# Patient Record
Sex: Male | Born: 1960 | Race: White | Hispanic: No | Smoking: Current every day smoker
Health system: Southern US, Community
[De-identification: ages and names within clinical notes are randomized; demographics above are authoritative.]

## PROBLEM LIST (undated history)

## (undated) DIAGNOSIS — N289 Disorder of kidney and ureter, unspecified: Secondary | ICD-10-CM

## (undated) DIAGNOSIS — I10 Essential (primary) hypertension: Secondary | ICD-10-CM

## (undated) HISTORY — PX: OTHER SURGICAL HISTORY: SHX169

---

## 1999-02-08 ENCOUNTER — Ambulatory Visit (HOSPITAL_BASED_OUTPATIENT_CLINIC_OR_DEPARTMENT_OTHER): Admission: RE | Admit: 1999-02-08 | Discharge: 1999-02-08 | Payer: Self-pay | Admitting: Surgery

## 1999-02-08 ENCOUNTER — Encounter (INDEPENDENT_AMBULATORY_CARE_PROVIDER_SITE_OTHER): Payer: Self-pay | Admitting: Specialist

## 2006-01-31 ENCOUNTER — Ambulatory Visit: Payer: Self-pay | Admitting: Family Medicine

## 2006-03-27 ENCOUNTER — Ambulatory Visit: Payer: Self-pay | Admitting: Family Medicine

## 2006-07-12 ENCOUNTER — Ambulatory Visit: Payer: Self-pay | Admitting: Family Medicine

## 2014-12-30 ENCOUNTER — Encounter (HOSPITAL_COMMUNITY): Payer: Self-pay

## 2014-12-30 ENCOUNTER — Emergency Department (HOSPITAL_COMMUNITY)
Admission: EM | Admit: 2014-12-30 | Discharge: 2014-12-30 | Disposition: A | Discharge status: Home / Self Care | Payer: BLUE CROSS/BLUE SHIELD | Attending: Emergency Medicine | Admitting: Emergency Medicine

## 2014-12-30 ENCOUNTER — Emergency Department (HOSPITAL_COMMUNITY): Payer: BLUE CROSS/BLUE SHIELD

## 2014-12-30 DIAGNOSIS — I10 Essential (primary) hypertension: Secondary | ICD-10-CM | POA: Insufficient documentation

## 2014-12-30 DIAGNOSIS — N200 Calculus of kidney: Secondary | ICD-10-CM

## 2014-12-30 DIAGNOSIS — R112 Nausea with vomiting, unspecified: Secondary | ICD-10-CM | POA: Diagnosis present

## 2014-12-30 HISTORY — DX: Disorder of kidney and ureter, unspecified: N28.9

## 2014-12-30 HISTORY — DX: Essential (primary) hypertension: I10

## 2014-12-30 LAB — URINALYSIS, ROUTINE W REFLEX MICROSCOPIC
BILIRUBIN URINE: NEGATIVE
Glucose, UA: NEGATIVE mg/dL
Ketones, ur: NEGATIVE mg/dL
Leukocytes, UA: NEGATIVE
NITRITE: NEGATIVE
PH: 5.5 (ref 5.0–8.0)
Protein, ur: 30 mg/dL — AB
Urobilinogen, UA: 0.2 mg/dL (ref 0.0–1.0)

## 2014-12-30 LAB — URINE MICROSCOPIC-ADD ON

## 2014-12-30 LAB — CBC WITH DIFFERENTIAL/PLATELET
BASOS ABS: 0 10*3/uL (ref 0.0–0.1)
Basophils Relative: 0 %
EOS PCT: 0 %
Eosinophils Absolute: 0.1 10*3/uL (ref 0.0–0.7)
HEMATOCRIT: 44.8 % (ref 39.0–52.0)
HEMOGLOBIN: 15.7 g/dL (ref 13.0–17.0)
LYMPHS ABS: 1.6 10*3/uL (ref 0.7–4.0)
LYMPHS PCT: 11 %
MCH: 31.3 pg (ref 26.0–34.0)
MCHC: 35 g/dL (ref 30.0–36.0)
MCV: 89.4 fL (ref 78.0–100.0)
Monocytes Absolute: 0.7 10*3/uL (ref 0.1–1.0)
Monocytes Relative: 5 %
NEUTROS ABS: 12.2 10*3/uL — AB (ref 1.7–7.7)
Neutrophils Relative %: 84 %
Platelets: 291 10*3/uL (ref 150–400)
RBC: 5.01 MIL/uL (ref 4.22–5.81)
RDW: 12.4 % (ref 11.5–15.5)
WBC: 14.6 10*3/uL — AB (ref 4.0–10.5)

## 2014-12-30 LAB — BASIC METABOLIC PANEL
Anion gap: 10 (ref 5–15)
BUN: 13 mg/dL (ref 6–20)
CALCIUM: 9.6 mg/dL (ref 8.9–10.3)
CO2: 27 mmol/L (ref 22–32)
CREATININE: 1.08 mg/dL (ref 0.61–1.24)
Chloride: 103 mmol/L (ref 101–111)
GFR calc Af Amer: >60 mL/min (ref >60)
GFR calc non Af Amer: >60 mL/min (ref >60)
GLUCOSE: 144 mg/dL — AB (ref 65–99)
Potassium: 4.6 mmol/L (ref 3.5–5.1)
Sodium: 140 mmol/L (ref 135–145)

## 2014-12-30 MED ORDER — ONDANSETRON HCL 4 MG/2ML IJ SOLN
4.0000 mg | Freq: Once | INTRAMUSCULAR | Status: AC
  Administered 2014-12-30: 4 mg via INTRAVENOUS
  Filled 2014-12-30: qty 2

## 2014-12-30 MED ORDER — OXYCODONE-ACETAMINOPHEN 5-325 MG PO TABS: 1.0000 | ORAL_TABLET | Freq: Four times a day (QID) | ORAL | Status: DC | PRN

## 2014-12-30 MED ORDER — SODIUM CHLORIDE 0.9 % IV BOLUS (SEPSIS)
1000.0000 mL | Freq: Once | INTRAVENOUS | Status: AC
  Administered 2014-12-30: 1000 mL via INTRAVENOUS

## 2014-12-30 MED ORDER — TAMSULOSIN HCL 0.4 MG PO CAPS: 0.4000 mg | ORAL_CAPSULE | Freq: Every day | ORAL | Status: DC

## 2014-12-30 MED ORDER — KETOROLAC TROMETHAMINE 30 MG/ML IJ SOLN
30.0000 mg | Freq: Once | INTRAMUSCULAR | Status: AC
  Administered 2014-12-30: 30 mg via INTRAVENOUS
  Filled 2014-12-30: qty 1

## 2014-12-30 MED ORDER — OXYCODONE-ACETAMINOPHEN 5-325 MG PO TABS: 2.0000 | ORAL_TABLET | ORAL | Status: DC | PRN

## 2014-12-30 MED ORDER — FENTANYL CITRATE (PF) 100 MCG/2ML IJ SOLN
50.0000 ug | Freq: Once | INTRAMUSCULAR | Status: AC
  Administered 2014-12-30: 50 ug via INTRAVENOUS
  Filled 2014-12-30: qty 2

## 2014-12-30 MED ORDER — HYDROMORPHONE HCL 1 MG/ML IJ SOLN
1.0000 mg | Freq: Once | INTRAMUSCULAR | Status: AC
  Administered 2014-12-30: 1 mg via INTRAVENOUS
  Filled 2014-12-30: qty 1

## 2014-12-30 MED ORDER — ONDANSETRON 4 MG PO TBDP: 4.0000 mg | ORAL_TABLET | Freq: Three times a day (TID) | ORAL | Status: DC | PRN

## 2014-12-30 NOTE — Discharge Instructions (Signed)
Kidney Stones °Kidney stones (urolithiasis) are deposits that form inside your kidneys. The intense pain is caused by the stone moving through the urinary tract. When the stone moves, the ureter goes into spasm around the stone. The stone is usually passed in the urine.  °CAUSES  °· A disorder that makes certain neck glands produce too much parathyroid hormone (primary hyperparathyroidism). °· A buildup of uric acid crystals, similar to gout in your joints. °· Narrowing (stricture) of the ureter. °· A kidney obstruction present at birth (congenital obstruction). °· Previous surgery on the kidney or ureters. °· Numerous kidney infections. °SYMPTOMS  °· Feeling sick to your stomach (nauseous). °· Throwing up (vomiting). °· Blood in the urine (hematuria). °· Pain that usually spreads (radiates) to the groin. °· Frequency or urgency of urination. °DIAGNOSIS  °· Taking a history and physical exam. °· Blood or urine tests. °· CT scan. °· Occasionally, an examination of the inside of the urinary bladder (cystoscopy) is performed. °TREATMENT  °· Observation. °· Increasing your fluid intake. °· Extracorporeal shock wave lithotripsy--This is a noninvasive procedure that uses shock waves to break up kidney stones. °· Surgery may be needed if you have severe pain or persistent obstruction. There are various surgical procedures. Most of the procedures are performed with the use of small instruments. Only small incisions are needed to accommodate these instruments, so recovery time is minimized. °The size, location, and chemical composition are all important variables that will determine the proper choice of action for you. Talk to your health care provider to better understand your situation so that you will minimize the risk of injury to yourself and your kidney.  °HOME CARE INSTRUCTIONS  °· Drink enough water and fluids to keep your urine clear or pale yellow. This will help you to pass the stone or stone fragments. °· Strain  all urine through the provided strainer. Keep all particulate matter and stones for your health care provider to see. The stone causing the pain may be as small as a grain of salt. It is very important to use the strainer each and every time you pass your urine. The collection of your stone will allow your health care provider to analyze it and verify that a stone has actually passed. The stone analysis will often identify what you can do to reduce the incidence of recurrences. °· Only take over-the-counter or prescription medicines for pain, discomfort, or fever as directed by your health care provider. °· Keep all follow-up visits as told by your health care provider. This is important. °· Get follow-up X-rays if required. The absence of pain does not always mean that the stone has passed. It may have only stopped moving. If the urine remains completely obstructed, it can cause loss of kidney function or even complete destruction of the kidney. It is your responsibility to make sure X-rays and follow-ups are completed. Ultrasounds of the kidney can show blockages and the status of the kidney. Ultrasounds are not associated with any radiation and can be performed easily in a matter of minutes. °· Make changes to your daily diet as told by your health care provider. You may be told to: °¨ Limit the amount of salt that you eat. °¨ Eat 5 or more servings of fruits and vegetables each day. °¨ Limit the amount of meat, poultry, fish, and eggs that you eat. °· Collect a 24-hour urine sample as told by your health care provider. You may need to collect another urine sample every 6-12   months. °SEEK MEDICAL CARE IF: °· You experience pain that is progressive and unresponsive to any pain medicine you have been prescribed. °SEEK IMMEDIATE MEDICAL CARE IF:  °· Pain cannot be controlled with the prescribed medicine. °· You have a fever or shaking chills. °· The severity or intensity of pain increases over 18 hours and is not  relieved by pain medicine. °· You develop a new onset of abdominal pain. °· You feel faint or pass out. °· You are unable to urinate. °  °This information is not intended to replace advice given to you by your health care provider. Make sure you discuss any questions you have with your health care provider. °  °Document Released: 02/21/2005 Document Revised: 11/12/2014 Document Reviewed: 07/25/2012 °Elsevier Interactive Patient Education ©2016 [employer] ° °

## 2014-12-30 NOTE — ED Provider Notes (Signed)
CSN: [id number]     Arrival date & time 12/30/14  1851 History   First MD Initiated Contact with Patient 12/30/14 2007     Chief Complaint  Patient presents with  . Flank Pain     (Consider location/radiation/quality/duration/timing/severity/associated sxs/prior Treatment) HPI Patient presents with concern of new right flank pain, nausea, vomiting. Onset was 5 hours ago. Since onset symptoms of been persistent, severe, with sharp pain from the right flank radiating towards right inguinal crease. There is no fever, chills.  There is persistent nausea, multiple episodes of vomiting. No medication tolerated since onset. He acknowledges a history of multiple kidney stones, though none in recent past. No recent CT scan, ultrasound. Patient was generally well prior to the onset of symptoms.  Past Medical History  Diagnosis Date  . Renal disorder     kidney stones  . Hypertension    Past Surgical History  Procedure Laterality Date  . Cyst removed from left arm and wrist     No family history on file. Social History  Substance Use Topics  . Smoking status: Never Smoker   . Smokeless tobacco: None  . Alcohol Use: No    Review of Systems  Constitutional:       Per HPI, otherwise negative  HENT:       Per HPI, otherwise negative  Respiratory:       Per HPI, otherwise negative  Cardiovascular:       Per HPI, otherwise negative  Gastrointestinal: Positive for nausea and vomiting.  Endocrine:       Negative aside from HPI  Genitourinary:       Neg aside from HPI   Musculoskeletal:       Per HPI, otherwise negative  Skin: Negative.   Neurological: Negative for syncope.      Allergies  Review of patient's allergies indicates no known allergies.  Home Medications   Prior to Admission medications   Not on File   BP 135/89 mmHg  Pulse 74  Temp(Src) 98.6 F (37 C) (Oral)  Resp 15  Ht 5\' 4"  (1.626 m)  Wt 155 lb (70.308 kg)  BMI 26.59 kg/m2  SpO2 100% Physical  Exam  Constitutional: He is oriented to person, place, and time. He appears well-developed. No distress.  HENT:  Head: Normocephalic and atraumatic.  Eyes: Conjunctivae and EOM are normal.  Cardiovascular: Normal rate and regular rhythm.   Pulmonary/Chest: Effort normal. No stridor. No respiratory distress.  Abdominal: He exhibits no distension. There is tenderness in the right lower quadrant.    Musculoskeletal: He exhibits no edema.  Neurological: He is alert and oriented to person, place, and time.  Skin: Skin is warm and dry.  Psychiatric: He has a normal mood and affect.  Nursing note and vitals reviewed.   ED Course  Procedures (including critical care time) Labs Review Labs Reviewed  CBC WITH DIFFERENTIAL/PLATELET - Abnormal; Notable for the following:    WBC 14.6 (*)    Neutro Abs 12.2 (*)    All other components within normal limits  BASIC METABOLIC PANEL - Abnormal; Notable for the following:    Glucose, Bld 144 (*)    All other components within normal limits  URINALYSIS, ROUTINE W REFLEX MICROSCOPIC (NOT AT [facility])    Imaging Review Ct Renal Stone Study  12/30/2014  CLINICAL DATA:  54 year old male with right-sided flank pain and nausea and vomiting since 4:30 p.m. today. History of kidney stones. EXAM: CT ABDOMEN AND PELVIS WITHOUT CONTRAST TECHNIQUE:  Multidetector CT imaging of the abdomen and pelvis was performed following the standard protocol without IV contrast. COMPARISON:  No priors. FINDINGS: Lower chest:  Unremarkable. Hepatobiliary: Diffuse low attenuation throughout the hepatic parenchyma, compatible with hepatic steatosis. No definite cystic or solid hepatic lesions are identified on today's noncontrast CT examination. Calcified granuloma in the posterior aspect of the right lobe of the liver. The unenhanced appearance of the gallbladder is normal. Pancreas: No pancreatic mass or peripancreatic inflammatory changes on today's noncontrast CT examination.  Spleen: Unremarkable. Adrenals/Urinary Tract: Multiple nonobstructive calculi are present within the collecting systems of the kidneys bilaterally, the largest of which is in the lower pole of the left kidney measuring up to 6 mm in diameter. In addition, at the transition from middle to distal third of the right ureter there is a 5 mm calculus in the right ureter. This is associated with mild proximal right-sided hydroureteronephrosis. No left ureteral stones or bladder stones are identified. The unenhanced appearance of the urinary bladder is normal. Bilateral adrenal glands are normal in appearance. Stomach/Bowel: Unenhanced appearance of the stomach is normal. No pathologic dilatation of small bowel or colon. Numerous colonic diverticulae are noted, without surrounding inflammatory changes to suggest an acute diverticulitis at this time. Normal appendix. Vascular/Lymphatic: Atherosclerotic calcifications throughout the abdominal and pelvic vasculature, without evidence of aneurysm. No lymphadenopathy noted in the abdomen or pelvis on today's noncontrast CT examination. Reproductive: Prostate gland and seminal vesicles are unremarkable in appearance. Other: No significant volume of ascites.  No pneumoperitoneum. Musculoskeletal: There are no aggressive appearing lytic or blastic lesions noted in the visualized portions of the skeleton. IMPRESSION: 1. 5 mm partially obstructive calculus at the junction of middle and distal third of the right ureter with mild proximal hydroureteronephrosis indicative of mild obstruction at this time. 2. Multiple additional nonobstructive calculi within the collecting systems of the kidneys bilaterally, largest of which measures 6 mm in the lower pole collecting system of the left kidney. 3. Normal appendix. 4. Colonic diverticulosis without evidence of acute diverticulitis at this time. 5. Hepatic steatosis. 6. Atherosclerosis. Electronically Signed   By: Trudie Reed M.D.    On: 12/30/2014 21:28   I have personally reviewed and evaluated these images and lab results as part of my medical decision-making. On repeat exam the patient appears substantially more comfortable.  MDM  History kidney stones, but no recent evaluation presents with new severe right flank pain. Here, no evidence for obstruction or infection, pain well controlled. No evidence for other acute intra-abdominal processes.  With reduction in pain, the patient was discharged in stable condition to follow-up with urology in the clinic.  Gerhard Munch, MD 12/30/14 2140

## 2014-12-30 NOTE — ED Notes (Signed)
Pt c/o pain in r flank and n/v since 1630.  Reports history of kidney stones.

## 2015-01-06 MED FILL — Oxycodone w/ Acetaminophen Tab 5-325 MG: ORAL | Qty: 6 | Status: AC

## 2015-10-11 ENCOUNTER — Encounter (HOSPITAL_COMMUNITY): Payer: Self-pay | Admitting: Emergency Medicine

## 2015-10-11 ENCOUNTER — Emergency Department (HOSPITAL_COMMUNITY)
Admission: EM | Admit: 2015-10-11 | Discharge: 2015-10-11 | Disposition: A | Discharge status: Home / Self Care | Payer: BLUE CROSS/BLUE SHIELD | Attending: Emergency Medicine | Admitting: Emergency Medicine

## 2015-10-11 DIAGNOSIS — Z87891 Personal history of nicotine dependence: Secondary | ICD-10-CM | POA: Diagnosis not present

## 2015-10-11 DIAGNOSIS — Z79899 Other long term (current) drug therapy: Secondary | ICD-10-CM | POA: Diagnosis not present

## 2015-10-11 DIAGNOSIS — B029 Zoster without complications: Secondary | ICD-10-CM | POA: Diagnosis not present

## 2015-10-11 DIAGNOSIS — I10 Essential (primary) hypertension: Secondary | ICD-10-CM | POA: Insufficient documentation

## 2015-10-11 MED ORDER — HYDROCODONE-ACETAMINOPHEN 5-325 MG PO TABS: 1.0000 | ORAL_TABLET | Freq: Four times a day (QID) | ORAL | 0 refills | Status: DC | PRN

## 2015-10-11 MED ORDER — FAMCICLOVIR 500 MG PO TABS: 500.0000 mg | ORAL_TABLET | Freq: Three times a day (TID) | ORAL | 0 refills | Status: DC

## 2015-10-11 MED ORDER — FLUORESCEIN SODIUM 1 MG OP STRP
ORAL_STRIP | OPHTHALMIC | Status: AC
  Filled 2015-10-11: qty 2

## 2015-10-11 MED ORDER — TETRACAINE HCL 0.5 % OP SOLN
OPHTHALMIC | Status: AC
  Filled 2015-10-11: qty 4

## 2015-10-11 NOTE — ED Provider Notes (Signed)
[facility]-EMER[facility]ENCY DEPT Provider Note   CSN: [id number] Arrival date & time: 10/11/15  1326  First Provider Contact:  None       History   Chief Complaint Chief Complaint  Patient presents with  . Herpes Zoster    HPI Terry Ryan is a 55 y.o. male.  Patient complains of a rash to his forehead for 1 week.   The history is provided by the patient. A language interpreter was used.  Rash   This is a new problem. The current episode started more than 2 days ago. The problem has not changed since onset.The problem is associated with nothing. There has been no fever. The rash is present on the scalp. The pain is at a severity of 5/10. The pain is moderate. The pain has been constant since onset. Associated symptoms include blisters. He has tried nothing for the symptoms.    Past Medical History:  Diagnosis Date  . Hypertension   . Renal disorder    kidney stones    There are no active problems to display for this patient.   Past Surgical History:  Procedure Laterality Date  . cyst removed from left arm and wrist         Home Medications    Prior to Admission medications   Medication Sig Start Date End Date Taking? Authorizing Provider  ondansetron (ZOFRAN ODT) 4 MG disintegrating tablet Take 1 tablet (4 mg total) by mouth every 8 (eight) hours as needed for nausea or vomiting. 12/30/14   Gerhard Munchobert Lockwood, MD  oxyCODONE-acetaminophen (PERCOCET/ROXICET) 5-325 MG tablet Take 1 tablet by mouth every 6 (six) hours as needed for severe pain. 12/30/14   Gerhard Munchobert Lockwood, MD  tamsulosin (FLOMAX) 0.4 MG CAPS capsule Take 1 capsule (0.4 mg total) by mouth daily. 12/30/14   Gerhard Munchobert Lockwood, MD    Family History No family history on file.  Social History Social History  Substance Use Topics  . Smoking status: Former Smoker    Packs/day: 1.00    Years: 20.00    Types: Cigarettes    Quit date: 03/07/2002  . Smokeless tobacco: Never Used  . Alcohol use Yes     Comment:  occasionally     Allergies   Review of patient's allergies indicates no known allergies.   Review of Systems Review of Systems  Constitutional: Negative for appetite change and fatigue.  HENT: Negative for congestion, ear discharge and sinus pressure.   Eyes: Negative for discharge.  Respiratory: Negative for cough.   Cardiovascular: Negative for chest pain.  Gastrointestinal: Negative for abdominal pain and diarrhea.  Genitourinary: Negative for frequency and hematuria.  Musculoskeletal: Negative for back pain.  Skin: Positive for rash.  Neurological: Negative for seizures and headaches.  Psychiatric/Behavioral: Negative for hallucinations.     Physical Exam Updated Vital Signs BP 198/98 (BP Location: Left Arm)   Pulse 77   Temp 98.8 F (37.1 C) (Oral)   Resp 16   Ht 5\' 4"  (1.626 m)   Wt 167 lb (75.8 kg)   SpO2 97%   BMI 28.67 kg/m   Physical Exam  Constitutional: He is oriented to person, place, and time. He appears well-developed.  HENT:  Head: Normocephalic.  Eyes: Conjunctivae and EOM are normal. Pupils are equal, round, and reactive to light. No scleral icterus.  Floor seen to right eye is negative  Neck: Neck supple. No thyromegaly present.  Cardiovascular: Normal rate and regular rhythm.  Exam reveals no gallop and no friction rub.  No murmur heard. Pulmonary/Chest: No stridor. He has no wheezes. He has no rales. He exhibits no tenderness.  Abdominal: He exhibits no distension. There is no tenderness. There is no rebound.  Musculoskeletal: Normal range of motion. He exhibits no edema.  Lymphadenopathy:    He has no cervical adenopathy.  Neurological: He is oriented to person, place, and time. He exhibits normal muscle tone. Coordination normal.  Skin: No rash noted. There is erythema.  Patient has a rash to his right scalp that appears to be shingles  Psychiatric: He has a normal mood and affect. His behavior is normal.     ED Treatments / Results    Labs (all labs ordered are listed, but only abnormal results are displayed) Labs Reviewed - No data to display  EKG  EKG Interpretation None       Radiology No results found.  Procedures Procedures (including critical care time)  Medications Ordered in ED Medications  fluorescein 1 MG ophthalmic strip (not administered)  tetracaine (PONTOCAINE) 0.5 % ophthalmic solution (not administered)   Patient with shingles. Will treat with antiviral and pain medicine he will follow-up with an eye doctor to recheck his eye exam  Initial Impression / Assessment and Plan / ED Course  I have reviewed the triage vital signs and the nursing notes.  Pertinent labs & imaging results that were available during my care of the patient were reviewed by me and considered in my medical decision making (see chart for details).  Clinical Course   Hospital Account    Name Acct ID Class Status Primary Coverage   Terry Ryan [id number] Emergency Open BLUE CROSS BLUE SHIELD - BCBS OTHER        Guarantor Account (for Hospital Account [id number])    Name Relation to Pt Service Area Active? Acct Type   Terry Ryan [facility]SA Yes Personal/Family   Address Phone       [address] [place], [place] [address] [phone](H) [phone])          Coverage Information (for Hospital Account [id number])    F/O Payor/Plan Precert #   [facility] SHIELD/BCBS OTHER    Subscriber Subscriber #   Terry Ryan [id number]   Address Phone   [address] [place], [place] [address] [phone]          Final Clinical Impressions(s) / ED Diagnoses   Final diagnoses:  None    New Prescriptions New Prescriptions   No medications on file     Terry Berkshire, MD 10/11/15 1423

## 2015-10-11 NOTE — ED Triage Notes (Addendum)
[facility]. Patient c/o right eye pain. Per patient pain started Friday night but blisters appeared Saturday. Blistering noted from forehead into right eye. Patient reports some drainage. Denies any changes in vision.

## 2017-07-04 IMAGING — CT CT RENAL STONE PROTOCOL
2 of 4 series · 15 of 46 positions shown, 17 images · non-contrast
Comparison: No priors.

CLINICAL DATA: 54-year-old male with right-sided flank pain and
nausea and vomiting since [DATE] p.m. today. History of kidney stones.

EXAM:
CT ABDOMEN AND PELVIS WITHOUT CONTRAST
TECHNIQUE: Multidetector CT imaging of the abdomen and pelvis was performed
following the standard protocol without IV contrast.

[Series 2: standard/full over (age)lbs 5.0 · axial · 0.74mm/px · z∈[-454,-20]mm · 12 of 95 slices shown, 14 images]
[im 4/95  soft-tissue]
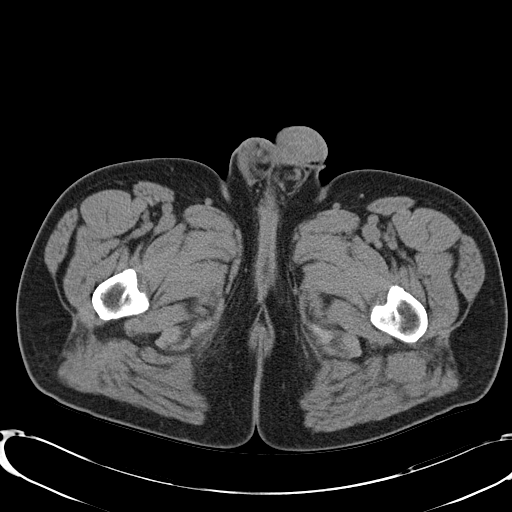
[im 4/95  bone]
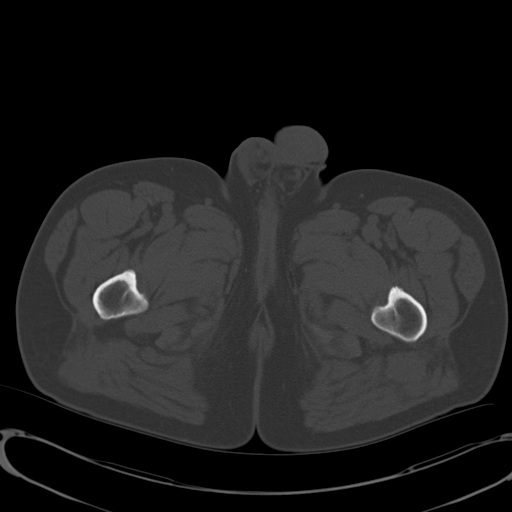
[im 12/95  soft-tissue]
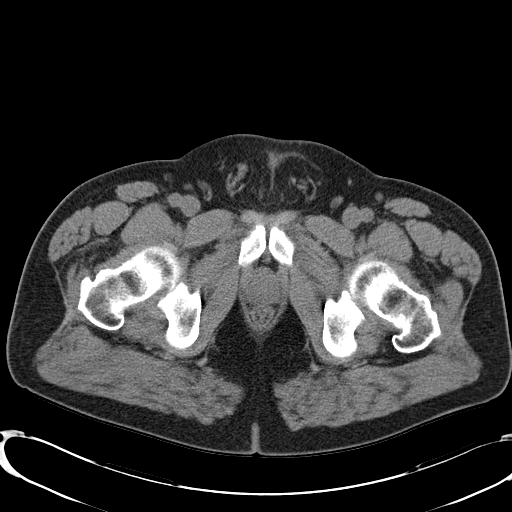
[im 20/95  soft-tissue]
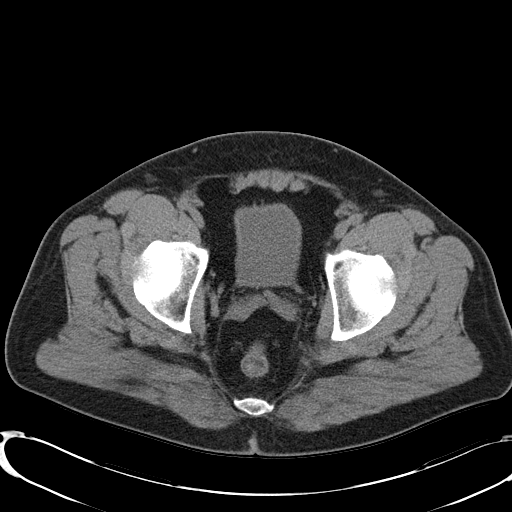
[im 28/95  soft-tissue]
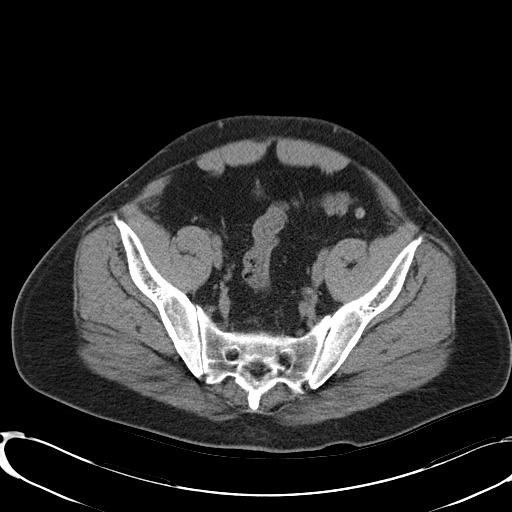
[im 36/95  soft-tissue]
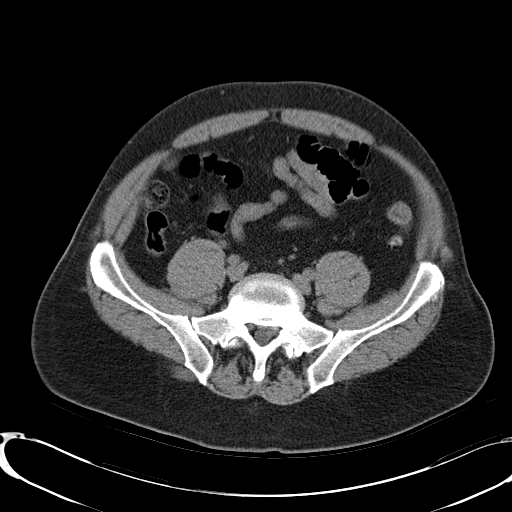
[im 44/95  soft-tissue]
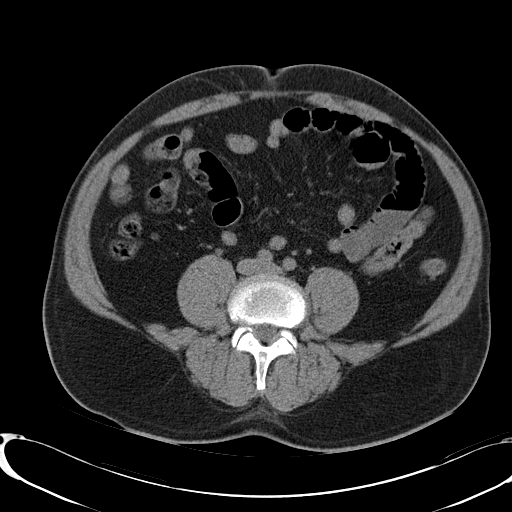
[im 51/95  soft-tissue]
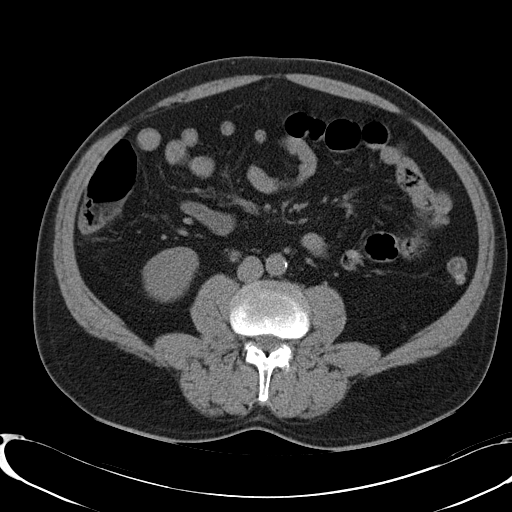
[im 59/95  soft-tissue]
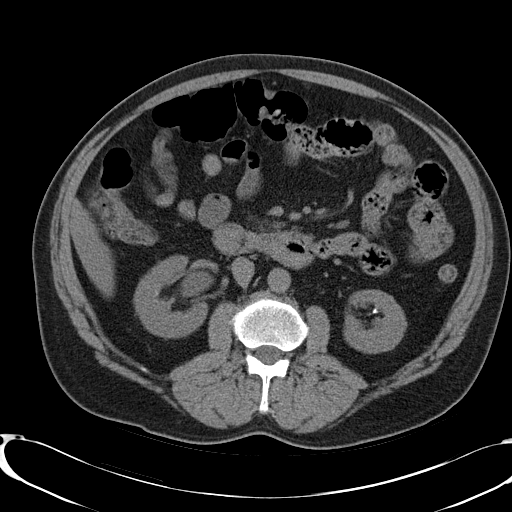
[im 67/95  soft-tissue]
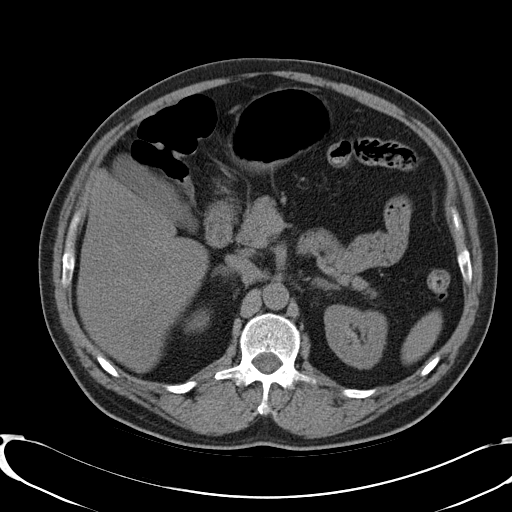
[im 67/95  bone]
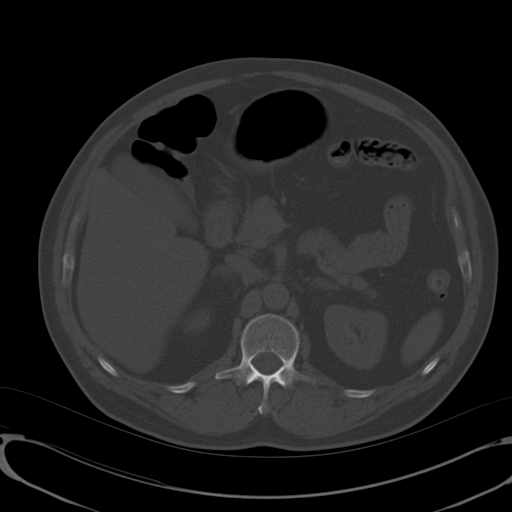
[im 75/95  soft-tissue]
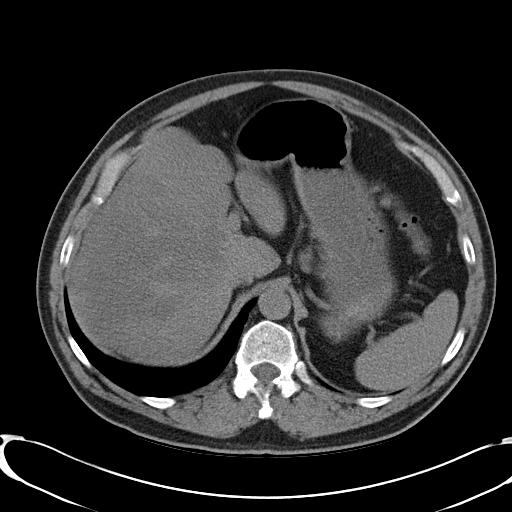
[im 83/95  soft-tissue]
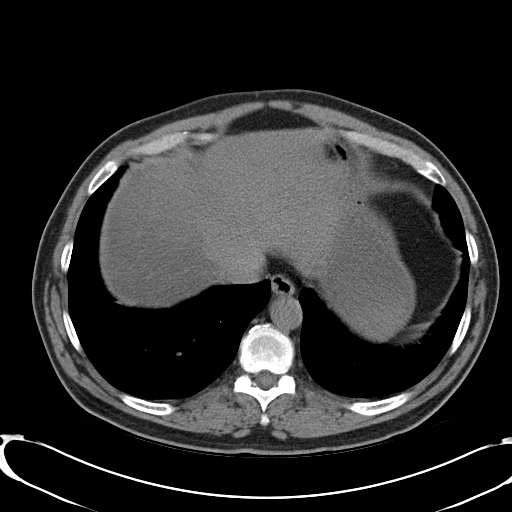
[im 91/95  soft-tissue]
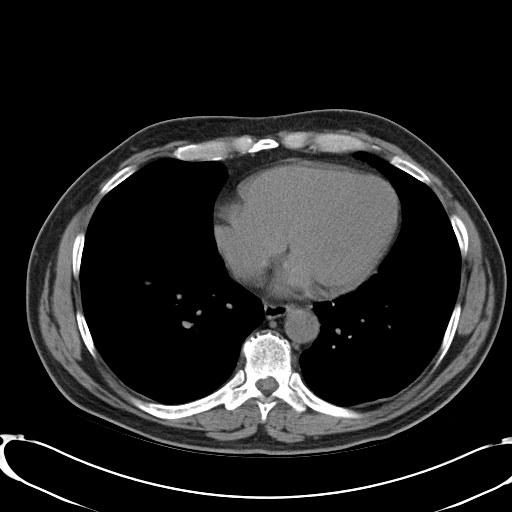

[Series 4: mpr coronal · coronal · 0.70mm/px · 3 of 93 slices shown]
[im 31/93  soft-tissue]
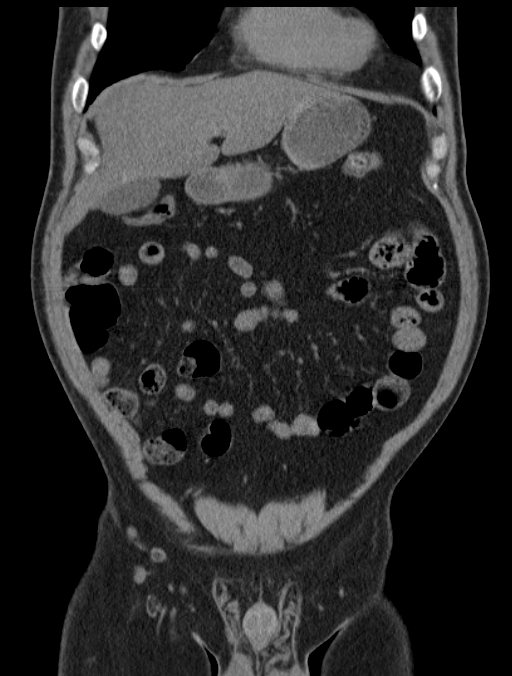
[im 41/93  soft-tissue]
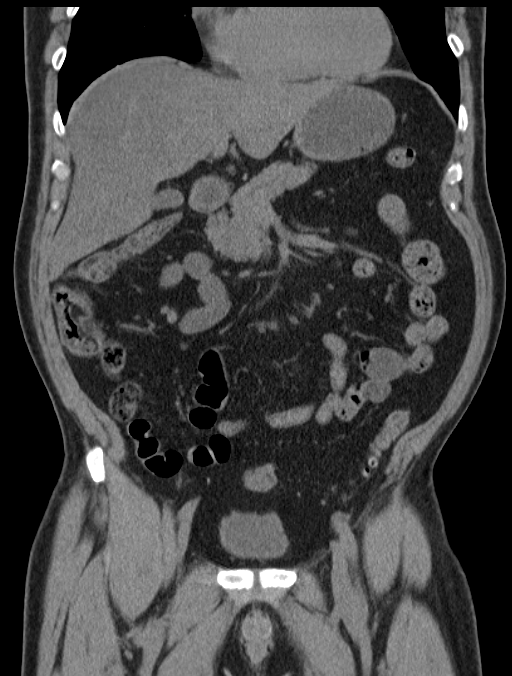
[im 52/93  soft-tissue]
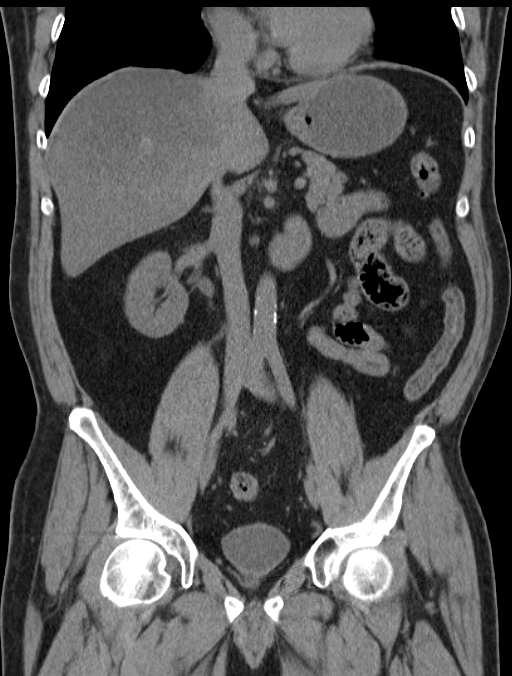

[15 of 46 positions shown; findings below may reference images not displayed]

FINDINGS: Lower chest:  Unremarkable.

Hepatobiliary: Diffuse low attenuation throughout the hepatic
parenchyma, compatible with hepatic steatosis. No definite cystic or
solid hepatic lesions are identified on today's noncontrast CT
examination. Calcified granuloma in the posterior aspect of the
right lobe of the liver. The unenhanced appearance of the
gallbladder is normal.

Pancreas: No pancreatic mass or peripancreatic inflammatory changes
on today's noncontrast CT examination.

Spleen: Unremarkable.

Adrenals/Urinary Tract: Multiple nonobstructive calculi are present
within the collecting systems of the kidneys bilaterally, the
largest of which is in the lower pole of the left kidney measuring
up to 6 mm in diameter. In addition, at the transition from middle
to distal third of the right ureter there is a 5 mm calculus in the
right ureter. This is associated with mild proximal right-sided
hydroureteronephrosis. No left ureteral stones or bladder stones are
identified. The unenhanced appearance of the urinary bladder is
normal. Bilateral adrenal glands are normal in appearance.

Stomach/Bowel: Unenhanced appearance of the stomach is normal. No
pathologic dilatation of small bowel or colon. Numerous colonic
diverticulae are noted, without surrounding inflammatory changes to
suggest an acute diverticulitis at this time. Normal appendix.

Vascular/Lymphatic: Atherosclerotic calcifications throughout the
abdominal and pelvic vasculature, without evidence of aneurysm. No
lymphadenopathy noted in the abdomen or pelvis on today's
noncontrast CT examination.

Reproductive: Prostate gland and seminal vesicles are unremarkable
in appearance.

Other: No significant volume of ascites.  No pneumoperitoneum.

Musculoskeletal: There are no aggressive appearing lytic or blastic
lesions noted in the visualized portions of the skeleton.
IMPRESSION: 1. 5 mm partially obstructive calculus at the junction of middle and
distal third of the right ureter with mild proximal
hydroureteronephrosis indicative of mild obstruction at this time.
2. Multiple additional nonobstructive calculi within the collecting
systems of the kidneys bilaterally, largest of which measures 6 mm
in the lower pole collecting system of the left kidney.
3. Normal appendix.
4. Colonic diverticulosis without evidence of acute diverticulitis
at this time.
5. Hepatic steatosis.
6. Atherosclerosis.

## 2017-12-13 ENCOUNTER — Encounter: Payer: Self-pay | Admitting: Family Medicine

## 2017-12-13 ENCOUNTER — Ambulatory Visit: Payer: Managed Care, Other (non HMO) | Admitting: Family Medicine

## 2017-12-13 ENCOUNTER — Ambulatory Visit (INDEPENDENT_AMBULATORY_CARE_PROVIDER_SITE_OTHER): Payer: Managed Care, Other (non HMO)

## 2017-12-13 VITALS — BP 157/84 | HR 84 | Temp 98.0°F | Ht 64.0 in | Wt 159.0 lb

## 2017-12-13 DIAGNOSIS — M25561 Pain in right knee: Secondary | ICD-10-CM

## 2017-12-13 DIAGNOSIS — Z87442 Personal history of urinary calculi: Secondary | ICD-10-CM | POA: Insufficient documentation

## 2017-12-13 DIAGNOSIS — S83421A Sprain of lateral collateral ligament of right knee, initial encounter: Secondary | ICD-10-CM | POA: Diagnosis not present

## 2017-12-13 MED ORDER — KETOROLAC TROMETHAMINE 60 MG/2ML IM SOLN
60.0000 mg | Freq: Once | INTRAMUSCULAR | Status: AC
  Administered 2017-12-13: 60 mg via INTRAMUSCULAR

## 2017-12-13 MED ORDER — NAPROXEN 500 MG PO TABS: 500.0000 mg | ORAL_TABLET | Freq: Two times a day (BID) | ORAL | 1 refills | Status: AC

## 2017-12-13 NOTE — Patient Instructions (Addendum)
Follow up in 2 weeks for reevaluation of knee and to determine effectiveness of therapy.  Make an appointment for a complete physical.      RICE for Routine Care of Injuries Many injuries can be cared for using rest, ice, compression, and elevation (RICE therapy). Using RICE therapy can help to lessen pain and swelling. It can help your body to heal. Rest Reduce your normal activities and avoid using the injured part of your body. You can go back to your normal activities when you feel okay and your doctor says it is okay. Ice Do not put ice on your bare skin.  Put ice in a plastic bag.  Place a towel between your skin and the bag.  Leave the ice on for 20 minutes, 2-3 times a day.  Do this for as long as told by your doctor. Compression Compression means putting pressure on the injured area. This can be done with an elastic bandage. If an elastic bandage has been applied:  Remove and reapply the bandage every 3-4 hours or as told by your doctor.  Make sure the bandage is not wrapped too tight. Wrap the bandage more loosely if part of your body beyond the bandage is blue, swollen, cold, painful, or loses feeling (numb).  See your doctor if the bandage seems to make your problems worse.  Elevation Elevation means keeping the injured area raised. Raise the injured area above your heart or the center of your chest if you can. When should I get help? You should get help if:  You keep having pain and swelling.  Your symptoms get worse.  Get help right away if: You should get help right away if:  You have sudden bad pain at or below the area of your injury.  You have redness or more swelling around your injury.  You have tingling or numbness at or below the injury that does not go away when you take off the bandage.  This information is not intended to replace advice given to you by your health care provider. Make sure you discuss any questions you have with your health care  provider. Document Released: 08/10/2007 Document Revised: 01/19/2016 Document Reviewed: 01/29/2014 Elsevier Interactive Patient Education  2017 [employer] How to Use a Knee Brace A knee brace is a device that you wear to support your knee, especially if the knee is healing after an injury or surgery. There are several types of knee braces. Some are designed to prevent an injury (prophylactic brace). These are often worn during sports. Others support an injured knee (functional brace) or keep it still while it heals (rehabilitative brace). People with severe arthritis of the knee may benefit from a brace that takes some pressure off the knee (unloader brace). Most knee braces are made from a combination of cloth and metal or plastic. You may need to wear a knee brace to:  Relieve knee pain.  Help your knee support your weight (improve stability).  Help you walk farther (improve mobility).  Prevent injury.  Support your knee while it heals from surgery or from an injury.  What are the risks? Generally, knee braces are very safe to wear. However, problems may occur, including:  Skin irritation that may lead to infection.  Making your condition worse if you wear the brace in the wrong way.  How to use a knee brace Different braces will have different instructions for use. Your health care provider will tell you or show you:  How  to put on your brace.  How to adjust the brace.  When and how often to wear the brace.  How to remove the brace.  If you will need any assistive devices in addition to the brace, such as crutches or a cane.  In general, your brace should:  Have the hinge of the brace line up with the bend of your knee.  Have straps, hooks, or tapes that fasten snugly around your leg.  Not feel too tight or too loose.  How to care for a knee brace  Check your brace often for signs of damage, such as loose connections or attachments. Your knee brace may get  damaged or wear out during normal use.  Wash the fabric parts of your brace with soap and water.  Read the insert that comes with your brace for other specific care instructions. Contact a health care provider if:  Your knee brace is too loose or too tight and you cannot adjust it.  Your knee brace causes skin redness, swelling, bruising, or irritation.  Your knee brace is not helping.  Your knee brace is making your knee pain worse. This information is not intended to replace advice given to you by your health care provider. Make sure you discuss any questions you have with your health care provider. Document Released: 05/14/2003 Document Revised: 07/30/2015 Document Reviewed: 06/16/2014 Elsevier Interactive Patient Education  2018 [employer] Lateral Collateral Knee Ligament Sprain The lateral collateral ligament (LCL) is a tough band of tissue that connects the thigh bone to the smaller of the lower leg bones. It is located on the outer side of the knee and it helps keep the knee stable. An LCL sprain is a stretch or tear in the LCL. What are the causes? This condition may be caused by:  A hard, direct hit (blow) to the outer side of your knee.  Running and changing directions quickly (cutting).  Twisting your knee forcefully.  What increases the risk? The following factors make you more likely to develop this condition:  Playing contact sports that involve cutting, such as football or soccer.  Participating in sports in which there is a risk of twisting the knee, like skiing or wrestling.  What are the signs or symptoms? Symptoms of this condition include:  A popping sound at the time of injury.  Pain on the outside of the knee.  Swelling in the knee.  Bruising around the knee.  Feeling unstable when you stand, like your knee will give way.  Difficulty walking on uneven surfaces.  How is this diagnosed? This condition may be diagnosed based on:  Your  medical history.  A physical exam.  Tests, such as an X-ray or MRI.  During your physical exam, your health care provider will feel the side of your knee and check for stability by moving it. How is this treated? This condition may be treated by:  Keeping weight off the knee until swelling and pain improve.  Raising (elevating) the knee above the level of your heart. This helps to reduce swelling.  Icing the knee. This helps to reduce swelling.  Taking an NSAID. This helps to reduce pain and swelling.  Using a knee brace and crutches while the injury heals.  Using a knee brace when participating in athletic activities.  Doing rehab exercises (physical therapy).  Surgery. This may be needed if: ? Your LCL tore all the way through. ? Your knee is unstable. ? Your knee is not getting  better with other treatments.  Follow these instructions at home: If you have a brace:  Wear it as told by your health care provider. Remove it only as told by your health care provider.  Loosen the brace if your toes tingle, become numb, or turn cold and blue.  Do not let your brace get wet if it is not waterproof.  Keep the brace clean. Managing pain, stiffness, and swelling  If directed, apply ice to the outer side of your knee. ? Put ice in a plastic bag. ? Place a towel between your skin and the bag. ? Leave the ice on for 20 minutes, 2-3 times a day.  Move your foot and toes often to avoid stiffness and to lessen swelling.  Elevate your knee above the level of your heart while you are sitting or lying down. Driving  Ask your health care provider when it is safe to drive if you have a brace on your leg. Activity  Return to your normal activities as told by your health care provider. Ask your health care provider what activities are safe for you.  Do exercises as told by your health care provider. Safety  Do not use the injured limb to support your body weight until your health  care provider says that you can. Use crutches as told by your health care provider. General instructions  Take over-the-counter and prescription medicines only as told by your health care provider.  Keep all follow-up visits as told by your health care provider. This is important. How is this prevented?  Warm up and stretch before being active.  Cool down and stretch after being active.  Give your body time to rest between periods of activity.  Make sure to use equipment that fits you.  Be safe and responsible while being active to avoid falls.  Do at least 150 minutes of moderate-intensity exercise each week, such as brisk walking or water aerobics.  Maintain physical fitness, including: ? Strength. ? Flexibility. ? Cardiovascular fitness. ? Endurance. Contact a health care provider if:  You continue to have pain and swelling for 2-4 weeks.  Your knee feels unstable or gives way. This information is not intended to replace advice given to you by your health care provider. Make sure you discuss any questions you have with your health care provider. Document Released: 06/14/2005 Document Revised: 10/27/2015 Document Reviewed: 12/31/2014 Elsevier Interactive Patient Education  [employer].

## 2017-12-13 NOTE — Progress Notes (Signed)
Subjective:    Patient ID: Terry Ryan, male    DOB: [birth year], 57 y.o.   MRN: [id number]  Chief Complaint:  Pain in right knee radiating into lower leg (fell two days ago; has been wearing brace)   HPI: Terry Ryan is a 58 y.o. male presenting on 12/13/2017 for Pain in right knee radiating into lower leg (fell two days ago; has been wearing brace)  Pt presents today with right knee pain. States he fell and twisted his knee on Sunday. States he heard a pop when he fell. States he know has pain and swelling in his right knee. The pain is anterolateral and worse with weight bearing and bending of knee. Pt states he has been wearing a wrap and icing his knee without relief of symptoms. He states the pain radiates down the lateral aspect of his lower leg. States the pain is aching to sharp in nature, 7/10 at worst. He has not taken any medications for the pain.   Relevant past medical, surgical, family, and social history reviewed and updated as indicated.  Allergies and medications reviewed and updated.   Past Medical History:  Diagnosis Date  . Hypertension   . Renal disorder    kidney stones    Past Surgical History:  Procedure Laterality Date  . cyst removed from left arm and wrist      Social History   Socioeconomic History  . Marital status: Married    Spouse name: Not on file  . Number of children: Not on file  . Years of education: Not on file  . Highest education level: Not on file  Occupational History  . Not on file  Social Needs  . Financial resource strain: Not on file  . Food insecurity:    Worry: Not on file    Inability: Not on file  . Transportation needs:    Medical: Not on file    Non-medical: Not on file  Tobacco Use  . Smoking status: Former Smoker    Packs/day: 1.00    Years: 20.00    Pack years: 20.00    Types: Cigarettes    Last attempt to quit: 03/07/2002    Years since quitting: 15.7  . Smokeless tobacco: Never Used  Substance  and Sexual Activity  . Alcohol use: Yes    Comment: occasionally  . Drug use: No  . Sexual activity: Not on file  Lifestyle  . Physical activity:    Days per week: Not on file    Minutes per session: Not on file  . Stress: Not on file  Relationships  . Social connections:    Talks on phone: Not on file    Gets together: Not on file    Attends religious service: Not on file    Active member of club or organization: Not on file    Attends meetings of clubs or organizations: Not on file    Relationship status: Not on file  . Intimate partner violence:    Fear of current or ex partner: Not on file    Emotionally abused: Not on file    Physically abused: Not on file    Forced sexual activity: Not on file  Other Topics Concern  . Not on file  Social History Narrative  . Not on file    Outpatient Encounter Medications as of 12/13/2017  Medication Sig  . naproxen (NAPROSYN) 500 MG tablet Take 1 tablet (500 mg total) by mouth 2 (  two) times daily with a meal.  . [DISCONTINUED] famciclovir (FAMVIR) 500 MG tablet Take 1 tablet (500 mg total) by mouth 3 (three) times daily.  . [DISCONTINUED] HYDROcodone-acetaminophen (NORCO/VICODIN) 5-325 MG tablet Take 1 tablet by mouth every 6 (six) hours as needed.  . [DISCONTINUED] ondansetron (ZOFRAN ODT) 4 MG disintegrating tablet Take 1 tablet (4 mg total) by mouth every 8 (eight) hours as needed for nausea or vomiting.  . [DISCONTINUED] oxyCODONE-acetaminophen (PERCOCET/ROXICET) 5-325 MG tablet Take 1 tablet by mouth every 6 (six) hours as needed for severe pain.  . [DISCONTINUED] tamsulosin (FLOMAX) 0.4 MG CAPS capsule Take 1 capsule (0.4 mg total) by mouth daily.   Facility-Administered Encounter Medications as of 12/13/2017  Medication  . ketorolac (TORADOL) injection 60 mg    No Known Allergies  Review of Systems  Constitutional: Negative for chills and fever.  Respiratory: Negative for chest tightness and shortness of breath.     Cardiovascular: Negative for chest pain, palpitations and leg swelling.  Musculoskeletal: Positive for arthralgias (right knee), gait problem (due to pain in right knee) and joint swelling (right knee).  Neurological: Negative for weakness and numbness.  All other systems reviewed and are negative.       Objective:    BP (!) 157/84 (BP Location: Right Arm, Cuff Size: Normal)   Pulse 84   Temp 98 F (36.7 C) (Oral)   Ht 5\' 4"  (1.626 m)   Wt 159 lb (72.1 kg)   BMI 27.29 kg/m    Wt Readings from Last 3 Encounters:  12/13/17 159 lb (72.1 kg)  10/11/15 167 lb (75.8 kg)  12/30/14 155 lb (70.3 kg)    Physical Exam  Constitutional: He is oriented to person, place, and time. He appears well-developed and well-nourished. He appears distressed (mild).  Cardiovascular: Normal rate, regular rhythm, normal heart sounds and intact distal pulses.  Pulses:      Dorsalis pedis pulses are 2+ on the right side, and 2+ on the left side.       Posterior tibial pulses are 2+ on the right side, and 2+ on the left side.  Pulmonary/Chest: Effort normal and breath sounds normal.  Musculoskeletal:       Right knee: He exhibits decreased range of motion and swelling. He exhibits no ecchymosis, no deformity, no laceration, no erythema, normal alignment, no LCL laxity, normal meniscus and no MCL laxity. Tenderness found. Lateral joint line and LCL tenderness noted. No medial joint line, no MCL and no patellar tendon tenderness noted.       Left knee: Normal.       Legs: Right Knee: Anterior drawer, posterior drawer, and Lachman without laxity. Pain with FROM flexion and extension. Pain with Varus stress, no opening. No sag sign.  Neurological: He is alert and oriented to person, place, and time. He has normal strength and normal reflexes. No sensory deficit.  Skin: Skin is warm and dry. Capillary refill takes less than 2 seconds.  Psychiatric: He has a normal mood and affect. His behavior is normal.  Judgment and thought content normal.  Nursing note and vitals reviewed.      X:Ray: Right knee: no acute bony abnormalities. Mild medial joint space narrowing. Preliminary x-ray reading by Kari Baars, FNP-C, [facility].   Pertinent labs & imaging results that were available during my care of the patient were reviewed by me and considered in my medical decision making.  Assessment & Plan:  Amman was seen today for pain in right knee radiating  into lower leg.  Diagnoses and all orders for this visit:  Acute pain of right knee -     DG Knee 1-2 Views Right; Future -     naproxen (NAPROSYN) 500 MG tablet; Take 1 tablet (500 mg total) by mouth 2 (two) times daily with a meal. -     ketorolac (TORADOL) injection 60 mg  Sprain of lateral collateral ligament of right knee, initial encounter Lateral joint line pain, lateral knee tenderness. Pain with varus stress testing, no opening. No laxity with anterior or posterior drawer maneuvers, no laxity with Lachman maneuver.  RICE. Hinged knee brace provided in office, wear with any weight bearing for 4-5 weeks. Crutches provided in office, use as needed for pain.  -     naproxen (NAPROSYN) 500 MG tablet; Take 1 tablet (500 mg total) by mouth 2 (two) times daily with a meal. -     ketorolac (TORADOL) injection 60 mg -     Ambulatory referral to Physical Therapy    Blood pressure noted to be elevated in office. Could be circumstantial due to pain. Will reevaluate in 2 weeks at return appointment.   Continue all other maintenance medications.  Follow up plan: Return in about 2 weeks (around 12/27/2017), or if symptoms worsen or fail to improve.  Make an appointment for a complete physical exam  Educational handout given for RICE, knee brace use, LCL / knee sprain  The above assessment and management plan was discussed with the patient. The patient verbalized understanding of and has agreed to the management plan. Patient is aware to call the  clinic if symptoms persist or worsen. Patient is aware when to return to the clinic for a follow-up visit. Patient educated on when it is appropriate to go to the emergency department.   Kari Baars, FNP-C [facility] [phone]

## 2017-12-22 ENCOUNTER — Telehealth: Payer: Self-pay | Admitting: Family Medicine

## 2017-12-22 ENCOUNTER — Telehealth: Payer: Self-pay

## 2017-12-22 NOTE — Telephone Encounter (Signed)
Left message for patient to call back so we can reschedule his appointment with Marcelino Duster that is scheduled for Thursday 12/28/17.

## 2017-12-26 ENCOUNTER — Ambulatory Visit: Payer: Managed Care, Other (non HMO) | Admitting: Family Medicine

## 2017-12-26 ENCOUNTER — Encounter: Payer: Self-pay | Admitting: Family Medicine

## 2017-12-26 VITALS — BP 172/100 | HR 92 | Temp 97.0°F | Ht 64.0 in | Wt 157.0 lb

## 2017-12-26 DIAGNOSIS — S83421A Sprain of lateral collateral ligament of right knee, initial encounter: Secondary | ICD-10-CM | POA: Insufficient documentation

## 2017-12-26 DIAGNOSIS — I10 Essential (primary) hypertension: Secondary | ICD-10-CM | POA: Insufficient documentation

## 2017-12-26 DIAGNOSIS — S83421D Sprain of lateral collateral ligament of right knee, subsequent encounter: Secondary | ICD-10-CM | POA: Diagnosis not present

## 2017-12-26 HISTORY — DX: Sprain of lateral collateral ligament of right knee, initial encounter: S83.421A

## 2017-12-26 MED ORDER — LISINOPRIL 5 MG PO TABS: 5.0000 mg | ORAL_TABLET | Freq: Every day | ORAL | 0 refills | Status: DC

## 2017-12-26 MED ORDER — CHLORTHALIDONE 25 MG PO TABS: 12.5000 mg | ORAL_TABLET | Freq: Every day | ORAL | 3 refills | Status: DC

## 2017-12-26 NOTE — Patient Instructions (Signed)
DASH Eating Plan DASH stands for "Dietary Approaches to Stop Hypertension." The DASH eating plan is a healthy eating plan that has been shown to reduce high blood pressure (hypertension). It may also reduce your risk for type 2 diabetes, heart disease, and stroke. The DASH eating plan may also help with weight loss. What are tips for following this plan? General guidelines  Avoid eating more than 2,300 mg (milligrams) of salt (sodium) a day. If you have hypertension, you may need to reduce your sodium intake to 1,500 mg a day.  Limit alcohol intake to no more than 1 drink a day for nonpregnant women and 2 drinks a day for men. One drink equals 12 oz of beer, 5 oz of wine, or 1 oz of hard liquor.  Work with your health care provider to maintain a healthy body weight or to lose weight. Ask what an ideal weight is for you.  Get at least 30 minutes of exercise that causes your heart to beat faster (aerobic exercise) most days of the week. Activities may include walking, swimming, or biking.  Work with your health care provider or diet and nutrition specialist (dietitian) to adjust your eating plan to your individual calorie needs. Reading food labels  Check food labels for the amount of sodium per serving. Choose foods with less than 5 percent of the Daily Value of sodium. Generally, foods with less than 300 mg of sodium per serving fit into this eating plan.  To find whole grains, look for the word "whole" as the first word in the ingredient list. Shopping  Buy products labeled as "low-sodium" or "no salt added."  Buy fresh foods. Avoid canned foods and premade or frozen meals. Cooking  Avoid adding salt when cooking. Use salt-free seasonings or herbs instead of table salt or sea salt. Check with your health care provider or pharmacist before using salt substitutes.  Do not fry foods. Cook foods using healthy methods such as baking, boiling, grilling, and broiling instead.  Cook with  heart-healthy oils, such as olive, canola, soybean, or sunflower oil. Meal planning   Eat a balanced diet that includes: ? 5 or more servings of fruits and vegetables each day. At each meal, try to fill half of your plate with fruits and vegetables. ? Up to 6-8 servings of whole grains each day. ? Less than 6 oz of lean meat, poultry, or fish each day. A 3-oz serving of meat is about the same size as a deck of cards. One egg equals 1 oz. ? 2 servings of low-fat dairy each day. ? A serving of nuts, seeds, or beans 5 times each week. ? Heart-healthy fats. Healthy fats called Omega-3 fatty acids are found in foods such as flaxseeds and coldwater fish, like sardines, salmon, and mackerel.  Limit how much you eat of the following: ? Canned or prepackaged foods. ? Food that is high in trans fat, such as fried foods. ? Food that is high in saturated fat, such as fatty meat. ? Sweets, desserts, sugary drinks, and other foods with added sugar. ? Full-fat dairy products.  Do not salt foods before eating.  Try to eat at least 2 vegetarian meals each week.  Eat more home-cooked food and less restaurant, buffet, and fast food.  When eating at a restaurant, ask that your food be prepared with less salt or no salt, if possible. What foods are recommended? The items listed may not be a complete list. Talk with your dietitian about what   dietary choices are best for you. Grains Whole-grain or whole-wheat bread. Whole-grain or whole-wheat pasta. Brown rice. Oatmeal. Quinoa. Bulgur. Whole-grain and low-sodium cereals. Pita bread. Low-fat, low-sodium crackers. Whole-wheat flour tortillas. Vegetables Fresh or frozen vegetables (raw, steamed, roasted, or grilled). Low-sodium or reduced-sodium tomato and vegetable juice. Low-sodium or reduced-sodium tomato sauce and tomato paste. Low-sodium or reduced-sodium canned vegetables. Fruits All fresh, dried, or frozen fruit. Canned fruit in natural juice (without  added sugar). Meat and other protein foods Skinless chicken or turkey. Ground chicken or turkey. Pork with fat trimmed off. Fish and seafood. Egg whites. Dried beans, peas, or lentils. Unsalted nuts, nut butters, and seeds. Unsalted canned beans. Lean cuts of beef with fat trimmed off. Low-sodium, lean deli meat. Dairy Low-fat (1%) or fat-free (skim) milk. Fat-free, low-fat, or reduced-fat cheeses. Nonfat, low-sodium ricotta or cottage cheese. Low-fat or nonfat yogurt. Low-fat, low-sodium cheese. Fats and oils Soft margarine without trans fats. Vegetable oil. Low-fat, reduced-fat, or light mayonnaise and salad dressings (reduced-sodium). Canola, safflower, olive, soybean, and sunflower oils. Avocado. Seasoning and other foods Herbs. Spices. Seasoning mixes without salt. Unsalted popcorn and pretzels. Fat-free sweets. What foods are not recommended? The items listed may not be a complete list. Talk with your dietitian about what dietary choices are best for you. Grains Baked goods made with fat, such as croissants, muffins, or some breads. Dry pasta or rice meal packs. Vegetables Creamed or fried vegetables. Vegetables in a cheese sauce. Regular canned vegetables (not low-sodium or reduced-sodium). Regular canned tomato sauce and paste (not low-sodium or reduced-sodium). Regular tomato and vegetable juice (not low-sodium or reduced-sodium). Pickles. Olives. Fruits Canned fruit in a light or heavy syrup. Fried fruit. Fruit in cream or butter sauce. Meat and other protein foods Fatty cuts of meat. Ribs. Fried meat. Bacon. Sausage. Bologna and other processed lunch meats. Salami. Fatback. Hotdogs. Bratwurst. Salted nuts and seeds. Canned beans with added salt. Canned or smoked fish. Whole eggs or egg yolks. Chicken or turkey with skin. Dairy Whole or 2% milk, cream, and half-and-half. Whole or full-fat cream cheese. Whole-fat or sweetened yogurt. Full-fat cheese. Nondairy creamers. Whipped toppings.  Processed cheese and cheese spreads. Fats and oils Butter. Stick margarine. Lard. Shortening. Ghee. Bacon fat. Tropical oils, such as coconut, palm kernel, or palm oil. Seasoning and other foods Salted popcorn and pretzels. Onion salt, garlic salt, seasoned salt, table salt, and sea salt. Worcestershire sauce. Tartar sauce. Barbecue sauce. Teriyaki sauce. Soy sauce, including reduced-sodium. Steak sauce. Canned and packaged gravies. Fish sauce. Oyster sauce. Cocktail sauce. Horseradish that you find on the shelf. Ketchup. Mustard. Meat flavorings and tenderizers. Bouillon cubes. Hot sauce and Tabasco sauce. Premade or packaged marinades. Premade or packaged taco seasonings. Relishes. Regular salad dressings. Where to find more information:  National Heart, Lung, and Blood Institute: www.nhlbi.nih.gov  American Heart Association: www.heart.org Summary  The DASH eating plan is a healthy eating plan that has been shown to reduce high blood pressure (hypertension). It may also reduce your risk for type 2 diabetes, heart disease, and stroke.  With the DASH eating plan, you should limit salt (sodium) intake to 2,300 mg a day. If you have hypertension, you may need to reduce your sodium intake to 1,500 mg a day.  When on the DASH eating plan, aim to eat more fresh fruits and vegetables, whole grains, lean proteins, low-fat dairy, and heart-healthy fats.  Work with your health care provider or diet and nutrition specialist (dietitian) to adjust your eating plan to your individual   calorie needs. This information is not intended to replace advice given to you by your health care provider. Make sure you discuss any questions you have with your health care provider. Document Released: 02/10/2011 Document Revised: 02/15/2016 Document Reviewed: 02/15/2016 Elsevier Interactive Patient Education  2018 [employer]  Hypertension Hypertension is another name for high blood pressure. High blood pressure  forces your heart to work harder to pump blood. This can cause problems over time. There are two numbers in a blood pressure reading. There is a top number (systolic) over a bottom number (diastolic). It is best to have a blood pressure below 120/80. Healthy choices can help lower your blood pressure. You may need medicine to help lower your blood pressure if:  Your blood pressure cannot be lowered with healthy choices.  Your blood pressure is higher than 130/80.  Follow these instructions at home: Eating and drinking  If directed, follow the DASH eating plan. This diet includes: ? Filling half of your plate at each meal with fruits and vegetables. ? Filling one quarter of your plate at each meal with whole grains. Whole grains include whole wheat pasta, brown rice, and whole grain bread. ? Eating or drinking low-fat dairy products, such as skim milk or low-fat yogurt. ? Filling one quarter of your plate at each meal with low-fat (lean) proteins. Low-fat proteins include fish, skinless chicken, eggs, beans, and tofu. ? Avoiding fatty meat, cured and processed meat, or chicken with skin. ? Avoiding premade or processed food.  Eat less than 1,500 mg of salt (sodium) a day.  Limit alcohol use to no more than 1 drink a day for nonpregnant women and 2 drinks a day for men. One drink equals 12 oz of beer, 5 oz of wine, or 1 oz of hard liquor. Lifestyle  Work with your doctor to stay at a healthy weight or to lose weight. Ask your doctor what the best weight is for you.  Get at least 30 minutes of exercise that causes your heart to beat faster (aerobic exercise) most days of the week. This may include walking, swimming, or biking.  Get at least 30 minutes of exercise that strengthens your muscles (resistance exercise) at least 3 days a week. This may include lifting weights or pilates.  Do not use any products that contain nicotine or tobacco. This includes cigarettes and e-cigarettes. If you  need help quitting, ask your doctor.  Check your blood pressure at home as told by your doctor.  Keep all follow-up visits as told by your doctor. This is important. Medicines  Take over-the-counter and prescription medicines only as told by your doctor. Follow directions carefully.  Do not skip doses of blood pressure medicine. The medicine does not work as well if you skip doses. Skipping doses also puts you at risk for problems.  Ask your doctor about side effects or reactions to medicines that you should watch for. Contact a doctor if:  You think you are having a reaction to the medicine you are taking.  You have headaches that keep coming back (recurring).  You feel dizzy.  You have swelling in your ankles.  You have trouble with your vision. Get help right away if:  You get a very bad headache.  You start to feel confused.  You feel weak or numb.  You feel faint.  You get very bad pain in your: ? Chest. ? Belly (abdomen).  You throw up (vomit) more than once.  You have trouble breathing.   Summary  Hypertension is another name for high blood pressure.  Making healthy choices can help lower blood pressure. If your blood pressure cannot be controlled with healthy choices, you may need to take medicine. This information is not intended to replace advice given to you by your health care provider. Make sure you discuss any questions you have with your health care provider. Document Released: 08/10/2007 Document Revised: 01/20/2016 Document Reviewed: 01/20/2016 Elsevier Interactive Patient Education  2018 Elsevier Inc.  

## 2017-12-26 NOTE — Progress Notes (Addendum)
Subjective:    Patient ID: Terry Ryan, male    DOB: 1960/05/13, 57 y.o.   MRN: [id number]  Chief Complaint:  Follow up right knee pain   HPI: Terry Ryan is a 57 y.o. male presenting on 12/26/2017 for Follow up right knee pain  Pt presents today for follow up of right knee injury. Pt states he has had some improvement with conservative therapy. States he has followed the treatment regimen. States he is still having pain with certain movements and with weight bearing. States he has a burning sensation around his knee cap and lateral lower leg tightness. States he does still feel a pop every now and then when her flexes or extends his knee.  Pt noted to be hypertensive again in office today. Further discussion revealed a history of hypertension. States he stopped his medications over 5 years ago. He states he has not checked his blood pressure lately. States he has had intermittent headaches over the last few months. Denies chest pain, leg swelling, shortness of breath, palpitations, weakness, blurred vision, or confusion. Denies any loss of function. States he was on lisinopril and HCTZ in the past, unsure of strength. He denies tobacco use. He does not watch his diet or exercise. States he is due for an eye exam.   Relevant past medical, surgical, family, and social history reviewed and updated as indicated.  Allergies and medications reviewed and updated.   Past Medical History:  Diagnosis Date  . Hypertension   . Renal disorder    kidney stones    Past Surgical History:  Procedure Laterality Date  . cyst removed from left arm and wrist      Social History   Socioeconomic History  . Marital status: Married    Spouse name: Not on file  . Number of children: Not on file  . Years of education: Not on file  . Highest education level: Not on file  Occupational History  . Not on file  Social Needs  . Financial resource strain: Not on file  . Food insecurity:   Worry: Not on file    Inability: Not on file  . Transportation needs:    Medical: Not on file    Non-medical: Not on file  Tobacco Use  . Smoking status: Former Smoker    Packs/day: 1.00    Years: 20.00    Pack years: 20.00    Types: Cigarettes    Last attempt to quit: 03/07/2002    Years since quitting: 15.8  . Smokeless tobacco: Never Used  Substance and Sexual Activity  . Alcohol use: Yes    Comment: occasionally  . Drug use: No  . Sexual activity: Not on file  Lifestyle  . Physical activity:    Days per week: Not on file    Minutes per session: Not on file  . Stress: Not on file  Relationships  . Social connections:    Talks on phone: Not on file    Gets together: Not on file    Attends religious service: Not on file    Active member of club or organization: Not on file    Attends meetings of clubs or organizations: Not on file    Relationship status: Not on file  . Intimate partner violence:    Fear of current or ex partner: Not on file    Emotionally abused: Not on file    Physically abused: Not on file    Forced sexual  activity: Not on file  Other Topics Concern  . Not on file  Social History Narrative  . Not on file    Outpatient Encounter Medications as of 12/26/2017  Medication Sig  . naproxen (NAPROSYN) 500 MG tablet Take 1 tablet (500 mg total) by mouth 2 (two) times daily with a meal.  . chlorthalidone (HYGROTON) 25 MG tablet Take 0.5 tablets (12.5 mg total) by mouth daily.  Marland Kitchen lisinopril (PRINIVIL,ZESTRIL) 5 MG tablet Take 1 tablet (5 mg total) by mouth daily.   No facility-administered encounter medications on file as of 12/26/2017.     No Known Allergies  Review of Systems  Constitutional: Negative for chills, fatigue and fever.  Eyes: Negative for photophobia and visual disturbance.  Respiratory: Negative for cough, chest tightness and shortness of breath.   Cardiovascular: Negative for chest pain, palpitations and leg swelling.    Gastrointestinal: Negative for abdominal pain.  Endocrine: Negative for polydipsia, polyphagia and polyuria.  Genitourinary: Negative for decreased urine volume.  Musculoskeletal: Positive for arthralgias (right knee). Negative for joint swelling.  Neurological: Positive for headaches. Negative for dizziness, tremors, seizures, syncope, facial asymmetry, speech difficulty, weakness, light-headedness and numbness.  Psychiatric/Behavioral: Negative for confusion.  All other systems reviewed and are negative.       Objective:    BP (!) 172/100 (BP Location: Right Arm, Cuff Size: Normal)   Pulse 92   Temp (!) 97 F (36.1 C) (Oral)   Ht '5\' 4"'  (1.626 m)   Wt 157 lb (71.2 kg)   BMI 26.95 kg/m    Wt Readings from Last 3 Encounters:  12/26/17 157 lb (71.2 kg)  12/13/17 159 lb (72.1 kg)  10/11/15 167 lb (75.8 kg)    Physical Exam  Constitutional: He is oriented to person, place, and time. He appears well-developed and well-nourished. He is cooperative. No distress.  HENT:  Head: Normocephalic and atraumatic.  Mouth/Throat: Uvula is midline, oropharynx is clear and moist and mucous membranes are normal.  Eyes: Pupils are equal, round, and reactive to light. Conjunctivae, EOM and lids are normal.  Neck: Trachea normal, normal range of motion and phonation normal. Neck supple. Normal carotid pulses and no JVD present. Carotid bruit is not present. No thyroid mass and no thyromegaly present.  Cardiovascular: Normal rate, regular rhythm, normal heart sounds and intact distal pulses. PMI is not displaced. Exam reveals no gallop and no friction rub.  No murmur heard. Pulses:      Dorsalis pedis pulses are 2+ on the right side, and 2+ on the left side.       Posterior tibial pulses are 2+ on the right side, and 2+ on the left side.  Pulmonary/Chest: Effort normal and breath sounds normal. No respiratory distress.  Abdominal: Soft. Normal appearance, normal aorta and bowel sounds are normal.  There is no tenderness.  Musculoskeletal:       Right knee: He exhibits decreased range of motion and effusion. He exhibits no swelling, no ecchymosis, no deformity, no laceration, no erythema, normal alignment, no LCL laxity, normal patellar mobility, no bony tenderness, normal meniscus and no MCL laxity. Tenderness found. Lateral joint line and LCL tenderness noted. No medial joint line, no MCL and no patellar tendon tenderness noted.  Right knee: anterior drawer, posterior drawer, and Lachman without laxity. Pain with flexion and extension. Pain with Varus stress, no opening.    Neurological: He is alert and oriented to person, place, and time. He has normal strength and normal reflexes. No cranial  nerve deficit or sensory deficit.  Skin: Skin is warm, dry and intact. Capillary refill takes less than 2 seconds.  Psychiatric: He has a normal mood and affect. His behavior is normal. Judgment and thought content normal. Cognition and memory are normal.  Nursing note and vitals reviewed.   EKG - Sinus rhythm with no ectopy or ST elevation. Monia Pouch, FNP-C  Pertinent labs & imaging results that were available during my care of the patient were reviewed by me and considered in my medical decision making.  Assessment & Plan:  Ahnaf was seen today for follow up right knee pain.  Diagnoses and all orders for this visit:  Sprain of lateral collateral ligament of right knee, subsequent encounter Continued pain after conservative therapy. Continue conservative measures. Referral to ortho.  -     Ambulatory referral to Orthopedic Surgery  Essential hypertension Previous diagnosis of hypertension. Has not taken medications in over 5 years. No labs since 2016. EKG in office today. Due to blood pressure of 172/100 will start on dual therapy. Pt to return in 1-2 weeks for follow-up. Provided BP log, to record BP at least 3 times per week and bring to visit. Pt aware of concerning signs/symptoms  and when to follow up. -     CMP14+EGFR -     CBC with Differential/Platelet -     Lipid panel -     TSH -     Microalbumin / creatinine urine ratio -     lisinopril (PRINIVIL,ZESTRIL) 5 MG tablet; Take 1 tablet (5 mg total) by mouth daily. -     chlorthalidone (HYGROTON) 25 MG tablet; Take 0.5 tablets (12.5 mg total) by mouth daily.      Continue all other maintenance medications.  Follow up plan: Return in about 2 weeks (around 01/09/2018), or if symptoms worsen or fail to improve.  Educational handout given for hypertension, DASH diet  The above assessment and management plan was discussed with the patient. The patient verbalized understanding of and has agreed to the management plan. Patient is aware to call the clinic if symptoms persist or worsen. Patient is aware when to return to the clinic for a follow-up visit. Patient educated on when it is appropriate to go to the emergency department.   Monia Pouch, FNP-C [facility] [phone]

## 2017-12-27 LAB — MICROALBUMIN / CREATININE URINE RATIO
CREATININE, UR: 143.6 mg/dL
MICROALB/CREAT RATIO: 10.1 mg/g{creat} (ref 0.0–30.0)
Microalbumin, Urine: 14.5 ug/mL

## 2017-12-27 LAB — CBC WITH DIFFERENTIAL/PLATELET
BASOS ABS: 0 10*3/uL (ref 0.0–0.2)
BASOS: 0 %
EOS (ABSOLUTE): 0.2 10*3/uL (ref 0.0–0.4)
Eos: 3 %
Hematocrit: 40.7 % (ref 37.5–51.0)
Hemoglobin: 14.5 g/dL (ref 13.0–17.7)
IMMATURE GRANS (ABS): 0 10*3/uL (ref 0.0–0.1)
IMMATURE GRANULOCYTES: 1 %
Lymphocytes Absolute: 2.7 10*3/uL (ref 0.7–3.1)
Lymphs: 37 %
MCH: 30.9 pg (ref 26.6–33.0)
MCHC: 35.6 g/dL (ref 31.5–35.7)
MCV: 87 fL (ref 79–97)
MONOS ABS: 0.8 10*3/uL (ref 0.1–0.9)
Monocytes: 11 %
NEUTROS ABS: 3.6 10*3/uL (ref 1.4–7.0)
NEUTROS PCT: 48 %
PLATELETS: 282 10*3/uL (ref 150–450)
RBC: 4.7 x10E6/uL (ref 4.14–5.80)
RDW: 12.3 % (ref 12.3–15.4)
WBC: 7.4 10*3/uL (ref 3.4–10.8)

## 2017-12-27 LAB — CMP14+EGFR
A/G RATIO: 1.9 (ref 1.2–2.2)
ALK PHOS: 62 IU/L (ref 39–117)
ALT: 16 IU/L (ref 0–44)
AST: 15 IU/L (ref 0–40)
Albumin: 4.5 g/dL (ref 3.5–5.5)
BUN/Creatinine Ratio: 16 (ref 9–20)
BUN: 14 mg/dL (ref 6–24)
Bilirubin Total: 0.2 mg/dL (ref 0.0–1.2)
CALCIUM: 9.3 mg/dL (ref 8.7–10.2)
CO2: 25 mmol/L (ref 20–29)
Chloride: 103 mmol/L (ref 96–106)
Creatinine, Ser: 0.87 mg/dL (ref 0.76–1.27)
GFR calc Af Amer: 112 mL/min/{1.73_m2} (ref >59)
GFR, EST NON AFRICAN AMERICAN: 97 mL/min/{1.73_m2} (ref >59)
GLOBULIN, TOTAL: 2.4 g/dL (ref 1.5–4.5)
Glucose: 98 mg/dL (ref 65–99)
POTASSIUM: 4.3 mmol/L (ref 3.5–5.2)
SODIUM: 142 mmol/L (ref 134–144)
Total Protein: 6.9 g/dL (ref 6.0–8.5)

## 2017-12-27 LAB — TSH: TSH: 2.37 u[IU]/mL (ref 0.450–4.500)

## 2017-12-27 LAB — LIPID PANEL
CHOLESTEROL TOTAL: 118 mg/dL (ref 100–199)
Chol/HDL Ratio: 3.8 ratio (ref 0.0–5.0)
HDL: 31 mg/dL — ABNORMAL LOW (ref >39)
LDL Calculated: 71 mg/dL (ref 0–99)
Triglycerides: 82 mg/dL (ref 0–149)
VLDL CHOLESTEROL CAL: 16 mg/dL (ref 5–40)

## 2017-12-28 ENCOUNTER — Ambulatory Visit: Payer: Managed Care, Other (non HMO) | Admitting: Family Medicine

## 2018-01-09 ENCOUNTER — Encounter: Payer: Self-pay | Admitting: Family Medicine

## 2018-01-09 ENCOUNTER — Ambulatory Visit: Payer: Managed Care, Other (non HMO) | Admitting: Family Medicine

## 2018-01-09 DIAGNOSIS — I10 Essential (primary) hypertension: Secondary | ICD-10-CM

## 2018-01-09 LAB — BMP8+EGFR
BUN/Creatinine Ratio: 22 — ABNORMAL HIGH (ref 9–20)
BUN: 20 mg/dL (ref 6–24)
CALCIUM: 10.3 mg/dL — AB (ref 8.7–10.2)
CHLORIDE: 100 mmol/L (ref 96–106)
CO2: 26 mmol/L (ref 20–29)
Creatinine, Ser: 0.93 mg/dL (ref 0.76–1.27)
GFR calc non Af Amer: 92 mL/min/{1.73_m2} (ref >59)
GFR, EST AFRICAN AMERICAN: 106 mL/min/{1.73_m2} (ref >59)
GLUCOSE: 103 mg/dL — AB (ref 65–99)
POTASSIUM: 4.5 mmol/L (ref 3.5–5.2)
Sodium: 139 mmol/L (ref 134–144)

## 2018-01-09 MED ORDER — LISINOPRIL 5 MG PO TABS: 20.0000 mg | ORAL_TABLET | Freq: Every day | ORAL | 0 refills | Status: DC

## 2018-01-09 NOTE — Patient Instructions (Signed)
DASH Eating Plan DASH stands for "Dietary Approaches to Stop Hypertension." The DASH eating plan is a healthy eating plan that has been shown to reduce high blood pressure (hypertension). It may also reduce your risk for type 2 diabetes, heart disease, and stroke. The DASH eating plan may also help with weight loss. What are tips for following this plan? General guidelines  Avoid eating more than 2,300 mg (milligrams) of salt (sodium) a day. If you have hypertension, you may need to reduce your sodium intake to 1,500 mg a day.  Limit alcohol intake to no more than 1 drink a day for nonpregnant women and 2 drinks a day for men. One drink equals 12 oz of beer, 5 oz of wine, or 1 oz of hard liquor.  Work with your health care provider to maintain a healthy body weight or to lose weight. Ask what an ideal weight is for you.  Get at least 30 minutes of exercise that causes your heart to beat faster (aerobic exercise) most days of the week. Activities may include walking, swimming, or biking.  Work with your health care provider or diet and nutrition specialist (dietitian) to adjust your eating plan to your individual calorie needs. Reading food labels  Check food labels for the amount of sodium per serving. Choose foods with less than 5 percent of the Daily Value of sodium. Generally, foods with less than 300 mg of sodium per serving fit into this eating plan.  To find whole grains, look for the word "whole" as the first word in the ingredient list. Shopping  Buy products labeled as "low-sodium" or "no salt added."  Buy fresh foods. Avoid canned foods and premade or frozen meals. Cooking  Avoid adding salt when cooking. Use salt-free seasonings or herbs instead of table salt or sea salt. Check with your health care provider or pharmacist before using salt substitutes.  Do not fry foods. Cook foods using healthy methods such as baking, boiling, grilling, and broiling instead.  Cook with  heart-healthy oils, such as olive, canola, soybean, or sunflower oil. Meal planning   Eat a balanced diet that includes: ? 5 or more servings of fruits and vegetables each day. At each meal, try to fill half of your plate with fruits and vegetables. ? Up to 6-8 servings of whole grains each day. ? Less than 6 oz of lean meat, poultry, or fish each day. A 3-oz serving of meat is about the same size as a deck of cards. One egg equals 1 oz. ? 2 servings of low-fat dairy each day. ? A serving of nuts, seeds, or beans 5 times each week. ? Heart-healthy fats. Healthy fats called Omega-3 fatty acids are found in foods such as flaxseeds and coldwater fish, like sardines, salmon, and mackerel.  Limit how much you eat of the following: ? Canned or prepackaged foods. ? Food that is high in trans fat, such as fried foods. ? Food that is high in saturated fat, such as fatty meat. ? Sweets, desserts, sugary drinks, and other foods with added sugar. ? Full-fat dairy products.  Do not salt foods before eating.  Try to eat at least 2 vegetarian meals each week.  Eat more home-cooked food and less restaurant, buffet, and fast food.  When eating at a restaurant, ask that your food be prepared with less salt or no salt, if possible. What foods are recommended? The items listed may not be a complete list. Talk with your dietitian about what   dietary choices are best for you. Grains Whole-grain or whole-wheat bread. Whole-grain or whole-wheat pasta. Brown rice. Oatmeal. Quinoa. Bulgur. Whole-grain and low-sodium cereals. Pita bread. Low-fat, low-sodium crackers. Whole-wheat flour tortillas. Vegetables Fresh or frozen vegetables (raw, steamed, roasted, or grilled). Low-sodium or reduced-sodium tomato and vegetable juice. Low-sodium or reduced-sodium tomato sauce and tomato paste. Low-sodium or reduced-sodium canned vegetables. Fruits All fresh, dried, or frozen fruit. Canned fruit in natural juice (without  added sugar). Meat and other protein foods Skinless chicken or turkey. Ground chicken or turkey. Pork with fat trimmed off. Fish and seafood. Egg whites. Dried beans, peas, or lentils. Unsalted nuts, nut butters, and seeds. Unsalted canned beans. Lean cuts of beef with fat trimmed off. Low-sodium, lean deli meat. Dairy Low-fat (1%) or fat-free (skim) milk. Fat-free, low-fat, or reduced-fat cheeses. Nonfat, low-sodium ricotta or cottage cheese. Low-fat or nonfat yogurt. Low-fat, low-sodium cheese. Fats and oils Soft margarine without trans fats. Vegetable oil. Low-fat, reduced-fat, or light mayonnaise and salad dressings (reduced-sodium). Canola, safflower, olive, soybean, and sunflower oils. Avocado. Seasoning and other foods Herbs. Spices. Seasoning mixes without salt. Unsalted popcorn and pretzels. Fat-free sweets. What foods are not recommended? The items listed may not be a complete list. Talk with your dietitian about what dietary choices are best for you. Grains Baked goods made with fat, such as croissants, muffins, or some breads. Dry pasta or rice meal packs. Vegetables Creamed or fried vegetables. Vegetables in a cheese sauce. Regular canned vegetables (not low-sodium or reduced-sodium). Regular canned tomato sauce and paste (not low-sodium or reduced-sodium). Regular tomato and vegetable juice (not low-sodium or reduced-sodium). Pickles. Olives. Fruits Canned fruit in a light or heavy syrup. Fried fruit. Fruit in cream or butter sauce. Meat and other protein foods Fatty cuts of meat. Ribs. Fried meat. Bacon. Sausage. Bologna and other processed lunch meats. Salami. Fatback. Hotdogs. Bratwurst. Salted nuts and seeds. Canned beans with added salt. Canned or smoked fish. Whole eggs or egg yolks. Chicken or turkey with skin. Dairy Whole or 2% milk, cream, and half-and-half. Whole or full-fat cream cheese. Whole-fat or sweetened yogurt. Full-fat cheese. Nondairy creamers. Whipped toppings.  Processed cheese and cheese spreads. Fats and oils Butter. Stick margarine. Lard. Shortening. Ghee. Bacon fat. Tropical oils, such as coconut, palm kernel, or palm oil. Seasoning and other foods Salted popcorn and pretzels. Onion salt, garlic salt, seasoned salt, table salt, and sea salt. Worcestershire sauce. Tartar sauce. Barbecue sauce. Teriyaki sauce. Soy sauce, including reduced-sodium. Steak sauce. Canned and packaged gravies. Fish sauce. Oyster sauce. Cocktail sauce. Horseradish that you find on the shelf. Ketchup. Mustard. Meat flavorings and tenderizers. Bouillon cubes. Hot sauce and Tabasco sauce. Premade or packaged marinades. Premade or packaged taco seasonings. Relishes. Regular salad dressings. Where to find more information:  National Heart, Lung, and Blood Institute: www.nhlbi.nih.gov  American Heart Association: www.heart.org Summary  The DASH eating plan is a healthy eating plan that has been shown to reduce high blood pressure (hypertension). It may also reduce your risk for type 2 diabetes, heart disease, and stroke.  With the DASH eating plan, you should limit salt (sodium) intake to 2,300 mg a day. If you have hypertension, you may need to reduce your sodium intake to 1,500 mg a day.  When on the DASH eating plan, aim to eat more fresh fruits and vegetables, whole grains, lean proteins, low-fat dairy, and heart-healthy fats.  Work with your health care provider or diet and nutrition specialist (dietitian) to adjust your eating plan to your individual   calorie needs. This information is not intended to replace advice given to you by your health care provider. Make sure you discuss any questions you have with your health care provider. Document Released: 02/10/2011 Document Revised: 02/15/2016 Document Reviewed: 02/15/2016 Elsevier Interactive Patient Education  2018 Elsevier Inc.  

## 2018-01-09 NOTE — Progress Notes (Signed)
Subjective:    Patient ID: Terry Ryan, male    DOB: Mar 24, 1960, 57 y.o.   MRN: [id number]  Chief Complaint:  Hypertension (2 week follow up; reports increased stress over last week, father in law passed away this morning)   HPI: Terry Ryan is a 57 y.o. male presenting on 01/09/2018 for Hypertension (2 week follow up; reports increased stress over last week, father in law passed away this morning)   1. Essential hypertension   Pt presents today for hypertension follow up. He states his blood pressure was doing well until 10 days ago. Pt states it has been running 160-170 / 90-100 over the last 10 days. States he is compliant with medications and denies adverse side effects. Denies chest pain, headaches, dizziness, weakness, leg swelling, numbness, or visual changes.    Relevant past medical, surgical, family, and social history reviewed and updated as indicated.  Allergies and medications reviewed and updated.   Past Medical History:  Diagnosis Date  . Hypertension   . Renal disorder    kidney stones    Past Surgical History:  Procedure Laterality Date  . cyst removed from left arm and wrist      Social History   Socioeconomic History  . Marital status: Married    Spouse name: Not on file  . Number of children: Not on file  . Years of education: Not on file  . Highest education level: Not on file  Occupational History  . Not on file  Social Needs  . Financial resource strain: Not on file  . Food insecurity:    Worry: Not on file    Inability: Not on file  . Transportation needs:    Medical: Not on file    Non-medical: Not on file  Tobacco Use  . Smoking status: Former Smoker    Packs/day: 1.00    Years: 20.00    Pack years: 20.00    Types: Cigarettes    Last attempt to quit: 03/07/2002    Years since quitting: 15.8  . Smokeless tobacco: Never Used  Substance and Sexual Activity  . Alcohol use: Yes    Comment: occasionally  . Drug use: No  .  Sexual activity: Not on file  Lifestyle  . Physical activity:    Days per week: Not on file    Minutes per session: Not on file  . Stress: Not on file  Relationships  . Social connections:    Talks on phone: Not on file    Gets together: Not on file    Attends religious service: Not on file    Active member of club or organization: Not on file    Attends meetings of clubs or organizations: Not on file    Relationship status: Not on file  . Intimate partner violence:    Fear of current or ex partner: Not on file    Emotionally abused: Not on file    Physically abused: Not on file    Forced sexual activity: Not on file  Other Topics Concern  . Not on file  Social History Narrative  . Not on file    Outpatient Encounter Medications as of 01/09/2018  Medication Sig  . chlorthalidone (HYGROTON) 25 MG tablet Take 0.5 tablets (12.5 mg total) by mouth daily.  Marland Kitchen lisinopril (PRINIVIL,ZESTRIL) 5 MG tablet Take 4 tablets (20 mg total) by mouth daily.  . naproxen (NAPROSYN) 500 MG tablet Take 1 tablet (500 mg total) by mouth  2 (two) times daily with a meal.  . [DISCONTINUED] lisinopril (PRINIVIL,ZESTRIL) 5 MG tablet Take 1 tablet (5 mg total) by mouth daily.   No facility-administered encounter medications on file as of 01/09/2018.     No Known Allergies  Review of Systems  Constitutional: Negative for chills, fatigue and fever.  Eyes: Negative for visual disturbance.  Respiratory: Negative for cough, chest tightness and shortness of breath.   Cardiovascular: Negative for chest pain, palpitations and leg swelling.  Musculoskeletal: Positive for arthralgias (right knee) and joint swelling (right knee).  Neurological: Negative for dizziness, tremors, seizures, syncope, facial asymmetry, speech difficulty, weakness, light-headedness, numbness and headaches.  All other systems reviewed and are negative.       Objective:    BP (!) 148/88 (BP Location: Left Arm, Cuff Size: Normal)    Pulse 63   Temp 97.6 F (36.4 C) (Oral)   Ht _0  (1.626 m)   Wt 163 lb (73.9 kg)   BMI 27.98 kg/m    Wt Readings from Last 3 Encounters:  01/09/18 163 lb (73.9 kg)  12/26/17 157 lb (71.2 kg)  12/13/17 159 lb (72.1 kg)    Physical Exam  Constitutional: He is oriented to person, place, and time. He appears well-developed and well-nourished. He is cooperative. No distress.  HENT:  Head: Normocephalic.  Eyes: Pupils are equal, round, and reactive to light. EOM are normal.  Cardiovascular: Normal rate, regular rhythm, normal heart sounds and intact distal pulses. Exam reveals no gallop and no friction rub.  No murmur heard. Pulmonary/Chest: Effort normal and breath sounds normal. No respiratory distress.  Neurological: He is alert and oriented to person, place, and time.  Skin: Skin is warm and dry. Capillary refill takes less than 2 seconds.  Psychiatric: He has a normal mood and affect. His behavior is normal. Judgment and thought content normal.    Results for orders placed or performed in visit on 12/26/17  CMP14+EGFR  Result Value Ref Range   Glucose 98 65 - 99 mg/dL   BUN 14 6 - 24 mg/dL   Creatinine, Ser 0.87 0.76 - 1.27 mg/dL   GFR calc non Af Amer 97 >59 mL/min/1.73   GFR calc Af Amer 112 >59 mL/min/1.73   BUN/Creatinine Ratio 16 9 - 20   Sodium 142 134 - 144 mmol/L   Potassium 4.3 3.5 - 5.2 mmol/L   Chloride 103 96 - 106 mmol/L   CO2 25 20 - 29 mmol/L   Calcium 9.3 8.7 - 10.2 mg/dL   Total Protein 6.9 6.0 - 8.5 g/dL   Albumin 4.5 3.5 - 5.5 g/dL   Globulin, Total 2.4 1.5 - 4.5 g/dL   Albumin/Globulin Ratio 1.9 1.2 - 2.2   Bilirubin Total 0.2 0.0 - 1.2 mg/dL   Alkaline Phosphatase 62 39 - 117 IU/L   AST 15 0 - 40 IU/L   ALT 16 0 - 44 IU/L  CBC with Differential/Platelet  Result Value Ref Range   WBC 7.4 3.4 - 10.8 x10E3/uL   RBC 4.70 4.14 - 5.80 x10E6/uL   Hemoglobin 14.5 13.0 - 17.7 g/dL   Hematocrit 40.7 37.5 - 51.0 %   MCV 87 79 - 97 fL   MCH 30.9 26.6  - 33.0 pg   MCHC 35.6 31.5 - 35.7 g/dL   RDW 12.3 12.3 - 15.4 %   Platelets 282 150 - 450 x10E3/uL   Neutrophils 48 Not Estab. %   Lymphs 37 Not Estab. %   Monocytes 11  Not Estab. %   Eos 3 Not Estab. %   Basos 0 Not Estab. %   Neutrophils Absolute 3.6 1.4 - 7.0 x10E3/uL   Lymphocytes Absolute 2.7 0.7 - 3.1 x10E3/uL   Monocytes Absolute 0.8 0.1 - 0.9 x10E3/uL   EOS (ABSOLUTE) 0.2 0.0 - 0.4 x10E3/uL   Basophils Absolute 0.0 0.0 - 0.2 x10E3/uL   Immature Granulocytes 1 Not Estab. %   Immature Grans (Abs) 0.0 0.0 - 0.1 x10E3/uL  Lipid panel  Result Value Ref Range   Cholesterol, Total 118 100 - 199 mg/dL   Triglycerides 82 0 - 149 mg/dL   HDL 31 (L) >39 mg/dL   VLDL Cholesterol Cal 16 5 - 40 mg/dL   LDL Calculated 71 0 - 99 mg/dL   Chol/HDL Ratio 3.8 0.0 - 5.0 ratio  TSH  Result Value Ref Range   TSH 2.370 0.450 - 4.500 uIU/mL  Microalbumin / creatinine urine ratio  Result Value Ref Range   Creatinine, Urine 143.6 Not Estab. mg/dL   Microalbumin, Urine 14.5 Not Estab. ug/mL   Microalb/Creat Ratio 10.1 0.0 - 30.0 mg/g creat       Pertinent labs & imaging results that were available during my care of the patient were reviewed by me and considered in my medical decision making.  Assessment & Plan:  Catarino was seen today for hypertension.  Diagnoses and all orders for this visit:  Essential hypertension Pt to titrate lisinopril up by 5 mg per week until he reaches 20 mg daily. Keep a log of BP. DASH diet. Exercise encouraged. Will recheck sodium and potassium today.  -     lisinopril (PRINIVIL,ZESTRIL) 5 MG tablet; Take 4 tablets (20 mg total) by mouth daily. -     BMP8+EGFR   Continue all other maintenance medications.  Follow up plan: Return in about 6 weeks (around 02/20/2018), or if symptoms worsen or fail to improve.  Educational handout given for DASH diet  The above assessment and management plan was discussed with the patient. The patient verbalized  understanding of and has agreed to the management plan. Patient is aware to call the clinic if symptoms persist or worsen. Patient is aware when to return to the clinic for a follow-up visit. Patient educated on when it is appropriate to go to the emergency department.   Monia Pouch, FNP-C [facility] [phone]

## 2018-01-11 DIAGNOSIS — Z029 Encounter for administrative examinations, unspecified: Secondary | ICD-10-CM

## 2018-02-03 ENCOUNTER — Telehealth: Payer: Self-pay | Admitting: Family Medicine

## 2018-02-03 ENCOUNTER — Other Ambulatory Visit: Payer: Self-pay

## 2018-02-03 DIAGNOSIS — I10 Essential (primary) hypertension: Secondary | ICD-10-CM

## 2018-02-03 MED ORDER — LISINOPRIL 5 MG PO TABS: 20.0000 mg | ORAL_TABLET | Freq: Every day | ORAL | 2 refills | Status: DC

## 2018-02-03 NOTE — Telephone Encounter (Signed)
What is the name of the medication? lisinopril  Have you contacted your pharmacy to request a refill? yes  Which pharmacy would you like this sent to? [facility], pt states it was upped to 20mg  he was previously taking 5mg  and [facility] has the one for 5mg  it needs to be changed to 20mg  he only has two pills left   Patient notified that their request is being sent to the clinical staff for review and that they should receive a call once it is complete. If they do not receive a call within 24 hours they can check with their pharmacy or our office.

## 2018-02-03 NOTE — Telephone Encounter (Signed)
done

## 2018-03-05 ENCOUNTER — Telehealth: Payer: Self-pay | Admitting: Family Medicine

## 2018-03-05 NOTE — Telephone Encounter (Signed)
What is the name of the medication? BP Medication pt has appt Friday will urn out before then  Have you contacted your pharmacy to request a refill? no  Which pharmacy would you like this sent to? [facility] [place]   Patient notified that their request is being sent to the clinical staff for review and that they should receive a call once it is complete. If they do not receive a call within 24 hours they can check with their pharmacy or our office.

## 2018-03-09 ENCOUNTER — Encounter: Payer: Self-pay | Admitting: Family Medicine

## 2018-03-09 ENCOUNTER — Ambulatory Visit (INDEPENDENT_AMBULATORY_CARE_PROVIDER_SITE_OTHER): Payer: Managed Care, Other (non HMO) | Admitting: Family Medicine

## 2018-03-09 VITALS — BP 142/88 | HR 85 | Temp 98.3°F | Ht 64.0 in | Wt 163.0 lb

## 2018-03-09 DIAGNOSIS — I10 Essential (primary) hypertension: Secondary | ICD-10-CM | POA: Diagnosis not present

## 2018-03-09 MED ORDER — CHLORTHALIDONE 25 MG PO TABS: 12.5000 mg | ORAL_TABLET | Freq: Every day | ORAL | 3 refills | Status: DC

## 2018-03-09 MED ORDER — LISINOPRIL 20 MG PO TABS: 20.0000 mg | ORAL_TABLET | Freq: Every day | ORAL | 1 refills | Status: DC

## 2018-03-09 NOTE — Progress Notes (Signed)
Subjective:    Patient ID: Terry Ryan, male    DOB: [birth year], 58 y.o.   MRN: [id number]  Chief Complaint:  Hypertension (medication refill)   HPI: Terry Ryan is a 58 y.o. male presenting on 03/09/2018 for Hypertension (medication refill)   1. Essential hypertension    Complaint with meds - Yes, but has been out of medications for at least 3 days Checking BP at home ranging 128/70 most days Exercising Regularly - No Watching Salt intake - No Pertinent ROS:  Headache - No Chest pain - No Dyspnea - No Palpitations - No LE edema - No They report good compliance with medications and can restate their regimen by memory. No medication side effects.  BP Readings from Last 3 Encounters:  03/09/18 (!) 142/88  01/09/18 (!) 148/88  12/26/17 (!) 172/100   Relevant past medical, surgical, family, and social history reviewed and updated as indicated.  Allergies and medications reviewed and updated.   Past Medical History:  Diagnosis Date  . Hypertension   . Renal disorder    kidney stones    Past Surgical History:  Procedure Laterality Date  . cyst removed from left arm and wrist      Social History   Socioeconomic History  . Marital status: Married    Spouse name: Not on file  . Number of children: Not on file  . Years of education: Not on file  . Highest education level: Not on file  Occupational History  . Not on file  Social Needs  . Financial resource strain: Not on file  . Food insecurity:    Worry: Not on file    Inability: Not on file  . Transportation needs:    Medical: Not on file    Non-medical: Not on file  Tobacco Use  . Smoking status: Former Smoker    Packs/day: 1.00    Years: 20.00    Pack years: 20.00    Types: Cigarettes    Last attempt to quit: 03/07/2002    Years since quitting: 16.0  . Smokeless tobacco: Never Used  Substance and Sexual Activity  . Alcohol use: Yes    Comment: occasionally  . Drug use: No  . Sexual  activity: Not on file  Lifestyle  . Physical activity:    Days per week: Not on file    Minutes per session: Not on file  . Stress: Not on file  Relationships  . Social connections:    Talks on phone: Not on file    Gets together: Not on file    Attends religious service: Not on file    Active member of club or organization: Not on file    Attends meetings of clubs or organizations: Not on file    Relationship status: Not on file  . Intimate partner violence:    Fear of current or ex partner: Not on file    Emotionally abused: Not on file    Physically abused: Not on file    Forced sexual activity: Not on file  Other Topics Concern  . Not on file  Social History Narrative  . Not on file    Outpatient Encounter Medications as of 03/09/2018  Medication Sig  . chlorthalidone (HYGROTON) 25 MG tablet Take 0.5 tablets (12.5 mg total) by mouth daily.  Marland Kitchen lisinopril (PRINIVIL,ZESTRIL) 20 MG tablet Take 1 tablet (20 mg total) by mouth daily.  . [DISCONTINUED] chlorthalidone (HYGROTON) 25 MG tablet Take 0.5 tablets (12.5  mg total) by mouth daily.  . [DISCONTINUED] lisinopril (PRINIVIL,ZESTRIL) 20 MG tablet Take 20 mg by mouth daily.  . [DISCONTINUED] lisinopril (PRINIVIL,ZESTRIL) 5 MG tablet Take 4 tablets (20 mg total) by mouth daily.   No facility-administered encounter medications on file as of 03/09/2018.     No Known Allergies  Review of Systems  Constitutional: Negative for chills, fatigue and fever.  Respiratory: Negative for chest tightness and shortness of breath.   Cardiovascular: Negative for chest pain, palpitations and leg swelling.  Genitourinary: Negative for decreased urine volume.  Musculoskeletal: Positive for arthralgias (knee, much improved since surgery).  Neurological: Negative for dizziness, syncope and headaches.  Psychiatric/Behavioral: Negative for confusion.  All other systems reviewed and are negative.       Objective:    BP (!) 142/88 (Cuff Size:  Normal)   Pulse 85   Temp 98.3 F (36.8 C) (Oral)   Ht _0  (1.626 m)   Wt 163 lb (73.9 kg)   BMI 27.98 kg/m    Wt Readings from Last 3 Encounters:  03/09/18 163 lb (73.9 kg)  01/09/18 163 lb (73.9 kg)  12/26/17 157 lb (71.2 kg)    Physical Exam Vitals signs and nursing note reviewed.  Constitutional:      General: He is not in acute distress.    Appearance: Normal appearance. He is well-developed, well-groomed and overweight.  HENT:     Head: Normocephalic and atraumatic.     Right Ear: Hearing, tympanic membrane, ear canal and external ear normal.     Left Ear: Hearing, tympanic membrane, ear canal and external ear normal.     Nose: Nose normal.     Mouth/Throat:     Lips: Pink.     Mouth: Mucous membranes are moist.     Pharynx: Oropharynx is clear. Uvula midline. No oropharyngeal exudate or posterior oropharyngeal erythema.  Eyes:     General: Lids are normal.     Extraocular Movements: Extraocular movements intact.     Conjunctiva/sclera: Conjunctivae normal.     Pupils: Pupils are equal, round, and reactive to light.  Neck:     Musculoskeletal: Neck supple.     Vascular: No carotid bruit or JVD.  Cardiovascular:     Rate and Rhythm: Normal rate and regular rhythm.     Heart sounds: Normal heart sounds. No murmur. No friction rub. No gallop.   Pulmonary:     Effort: Pulmonary effort is normal. No respiratory distress.     Breath sounds: Normal breath sounds.  Musculoskeletal:     Right lower leg: No edema.     Left lower leg: No edema.  Lymphadenopathy:     Cervical: No cervical adenopathy.  Skin:    General: Skin is warm and dry.     Capillary Refill: Capillary refill takes less than 2 seconds.  Neurological:     General: No focal deficit present.     Mental Status: He is alert and oriented to person, place, and time.     Cranial Nerves: Cranial nerves are intact.     Sensory: Sensation is intact.     Motor: Motor function is intact.     Coordination:  Coordination is intact.  Psychiatric:        Attention and Perception: Attention and perception normal.        Mood and Affect: Mood and affect normal.        Speech: Speech normal.        Behavior: Behavior normal.  Behavior is cooperative.        Thought Content: Thought content normal.        Cognition and Memory: Cognition and memory normal.        Judgment: Judgment normal.     Results for orders placed or performed in visit on 01/09/18  BMP8+EGFR  Result Value Ref Range   Glucose 103 (H) 65 - 99 mg/dL   BUN 20 6 - 24 mg/dL   Creatinine, Ser 0.93 0.76 - 1.27 mg/dL   GFR calc non Af Amer 92 >59 mL/min/1.73   GFR calc Af Amer 106 >59 mL/min/1.73   BUN/Creatinine Ratio 22 (H) 9 - 20   Sodium 139 134 - 144 mmol/L   Potassium 4.5 3.5 - 5.2 mmol/L   Chloride 100 96 - 106 mmol/L   CO2 26 20 - 29 mmol/L   Calcium 10.3 (H) 8.7 - 10.2 mg/dL       Pertinent labs & imaging results that were available during my care of the patient were reviewed by me and considered in my medical decision making.  Assessment & Plan:  [place]sworth was seen today for hypertension.  Diagnoses and all orders for this visit:  Essential hypertension DASH diet. Diet and exercise encouraged. Medications as prescribed. Report any new or worsening symptoms. Will not titrate medications today due to pt being out of medications for at least 3 days. Pt will report any abnormal highs or lows.  -     lisinopril (PRINIVIL,ZESTRIL) 20 MG tablet; Take 1 tablet (20 mg total) by mouth daily. -     chlorthalidone (HYGROTON) 25 MG tablet; Take 0.5 tablets (12.5 mg total) by mouth daily.    Continue all other maintenance medications.  Follow up plan: Return in about 3 months (around 06/08/2018), or if symptoms worsen or fail to improve.  Educational handout given for DASH diet  The above assessment and management plan was discussed with the patient. The patient verbalized understanding of and has agreed to the management  plan. Patient is aware to call the clinic if symptoms persist or worsen. Patient is aware when to return to the clinic for a follow-up visit. Patient educated on when it is appropriate to go to the emergency department.   Monia Pouch, FNP-C [facility] [phone]

## 2018-03-09 NOTE — Patient Instructions (Signed)
DASH Eating Plan  DASH stands for "Dietary Approaches to Stop Hypertension." The DASH eating plan is a healthy eating plan that has been shown to reduce high blood pressure (hypertension). It may also reduce your risk for type 2 diabetes, heart disease, and stroke. The DASH eating plan may also help with weight loss.  What are tips for following this plan?    General guidelines   Avoid eating more than 2,300 mg (milligrams) of salt (sodium) a day. If you have hypertension, you may need to reduce your sodium intake to 1,500 mg a day.   Limit alcohol intake to no more than 1 drink a day for nonpregnant women and 2 drinks a day for men. One drink equals 12 oz of beer, 5 oz of wine, or 1 oz of hard liquor.   Work with your health care provider to maintain a healthy body weight or to lose weight. Ask what an ideal weight is for you.   Get at least 30 minutes of exercise that causes your heart to beat faster (aerobic exercise) most days of the week. Activities may include walking, swimming, or biking.   Work with your health care provider or diet and nutrition specialist (dietitian) to adjust your eating plan to your individual calorie needs.  Reading food labels     Check food labels for the amount of sodium per serving. Choose foods with less than 5 percent of the Daily Value of sodium. Generally, foods with less than 300 mg of sodium per serving fit into this eating plan.   To find whole grains, look for the word "whole" as the first word in the ingredient list.  Shopping   Buy products labeled as "low-sodium" or "no salt added."   Buy fresh foods. Avoid canned foods and premade or frozen meals.  Cooking   Avoid adding salt when cooking. Use salt-free seasonings or herbs instead of table salt or sea salt. Check with your health care provider or pharmacist before using salt substitutes.   Do not fry foods. Cook foods using healthy methods such as baking, boiling, grilling, and broiling instead.   Cook with  heart-healthy oils, such as olive, canola, soybean, or sunflower oil.  Meal planning   Eat a balanced diet that includes:  ? 5 or more servings of fruits and vegetables each day. At each meal, try to fill half of your plate with fruits and vegetables.  ? Up to 6-8 servings of whole grains each day.  ? Less than 6 oz of lean meat, poultry, or fish each day. A 3-oz serving of meat is about the same size as a deck of cards. One egg equals 1 oz.  ? 2 servings of low-fat dairy each day.  ? A serving of nuts, seeds, or beans 5 times each week.  ? Heart-healthy fats. Healthy fats called Omega-3 fatty acids are found in foods such as flaxseeds and coldwater fish, like sardines, salmon, and mackerel.   Limit how much you eat of the following:  ? Canned or prepackaged foods.  ? Food that is high in trans fat, such as fried foods.  ? Food that is high in saturated fat, such as fatty meat.  ? Sweets, desserts, sugary drinks, and other foods with added sugar.  ? Full-fat dairy products.   Do not salt foods before eating.   Try to eat at least 2 vegetarian meals each week.   Eat more home-cooked food and less restaurant, buffet, and fast food.     When eating at a restaurant, ask that your food be prepared with less salt or no salt, if possible.  What foods are recommended?  The items listed may not be a complete list. Talk with your dietitian about what dietary choices are best for you.  Grains  Whole-grain or whole-wheat bread. Whole-grain or whole-wheat pasta. Brown rice. Oatmeal. Quinoa. Bulgur. Whole-grain and low-sodium cereals. Pita bread. Low-fat, low-sodium crackers. Whole-wheat flour tortillas.  Vegetables  Fresh or frozen vegetables (raw, steamed, roasted, or grilled). Low-sodium or reduced-sodium tomato and vegetable juice. Low-sodium or reduced-sodium tomato sauce and tomato paste. Low-sodium or reduced-sodium canned vegetables.  Fruits  All fresh, dried, or frozen fruit. Canned fruit in natural juice (without  added sugar).  Meat and other protein foods  Skinless chicken or turkey. Ground chicken or turkey. Pork with fat trimmed off. Fish and seafood. Egg whites. Dried beans, peas, or lentils. Unsalted nuts, nut butters, and seeds. Unsalted canned beans. Lean cuts of beef with fat trimmed off. Low-sodium, lean deli meat.  Dairy  Low-fat (1%) or fat-free (skim) milk. Fat-free, low-fat, or reduced-fat cheeses. Nonfat, low-sodium ricotta or cottage cheese. Low-fat or nonfat yogurt. Low-fat, low-sodium cheese.  Fats and oils  Soft margarine without trans fats. Vegetable oil. Low-fat, reduced-fat, or light mayonnaise and salad dressings (reduced-sodium). Canola, safflower, olive, soybean, and sunflower oils. Avocado.  Seasoning and other foods  Herbs. Spices. Seasoning mixes without salt. Unsalted popcorn and pretzels. Fat-free sweets.  What foods are not recommended?  The items listed may not be a complete list. Talk with your dietitian about what dietary choices are best for you.  Grains  Baked goods made with fat, such as croissants, muffins, or some breads. Dry pasta or rice meal packs.  Vegetables  Creamed or fried vegetables. Vegetables in a cheese sauce. Regular canned vegetables (not low-sodium or reduced-sodium). Regular canned tomato sauce and paste (not low-sodium or reduced-sodium). Regular tomato and vegetable juice (not low-sodium or reduced-sodium). Pickles. Olives.  Fruits  Canned fruit in a light or heavy syrup. Fried fruit. Fruit in cream or butter sauce.  Meat and other protein foods  Fatty cuts of meat. Ribs. Fried meat. Bacon. Sausage. Bologna and other processed lunch meats. Salami. Fatback. Hotdogs. Bratwurst. Salted nuts and seeds. Canned beans with added salt. Canned or smoked fish. Whole eggs or egg yolks. Chicken or turkey with skin.  Dairy  Whole or 2% milk, cream, and half-and-half. Whole or full-fat cream cheese. Whole-fat or sweetened yogurt. Full-fat cheese. Nondairy creamers. Whipped toppings.  Processed cheese and cheese spreads.  Fats and oils  Butter. Stick margarine. Lard. Shortening. Ghee. Bacon fat. Tropical oils, such as coconut, palm kernel, or palm oil.  Seasoning and other foods  Salted popcorn and pretzels. Onion salt, garlic salt, seasoned salt, table salt, and sea salt. Worcestershire sauce. Tartar sauce. Barbecue sauce. Teriyaki sauce. Soy sauce, including reduced-sodium. Steak sauce. Canned and packaged gravies. Fish sauce. Oyster sauce. Cocktail sauce. Horseradish that you find on the shelf. Ketchup. Mustard. Meat flavorings and tenderizers. Bouillon cubes. Hot sauce and Tabasco sauce. Premade or packaged marinades. Premade or packaged taco seasonings. Relishes. Regular salad dressings.  Where to find more information:   National Heart, Lung, and Blood Institute: www.nhlbi.nih.gov   American Heart Association: www.heart.org  Summary   The DASH eating plan is a healthy eating plan that has been shown to reduce high blood pressure (hypertension). It may also reduce your risk for type 2 diabetes, heart disease, and stroke.   With the   DASH eating plan, you should limit salt (sodium) intake to 2,300 mg a day. If you have hypertension, you may need to reduce your sodium intake to 1,500 mg a day.   When on the DASH eating plan, aim to eat more fresh fruits and vegetables, whole grains, lean proteins, low-fat dairy, and heart-healthy fats.   Work with your health care provider or diet and nutrition specialist (dietitian) to adjust your eating plan to your individual calorie needs.  This information is not intended to replace advice given to you by your health care provider. Make sure you discuss any questions you have with your health care provider.  Document Released: 02/10/2011 Document Revised: 02/15/2016 Document Reviewed: 02/15/2016  Elsevier Interactive Patient Education  2019 [employer]

## 2018-04-19 ENCOUNTER — Other Ambulatory Visit: Payer: Self-pay | Admitting: Family Medicine

## 2018-04-19 ENCOUNTER — Telehealth: Payer: Self-pay | Admitting: Family Medicine

## 2018-04-19 DIAGNOSIS — Z20828 Contact with and (suspected) exposure to other viral communicable diseases: Secondary | ICD-10-CM

## 2018-04-19 MED ORDER — OSELTAMIVIR PHOSPHATE 75 MG PO CAPS: 75.0000 mg | ORAL_CAPSULE | Freq: Every day | ORAL | 0 refills | Status: AC

## 2018-04-19 NOTE — Telephone Encounter (Signed)
RX sent

## 2018-04-19 NOTE — Telephone Encounter (Signed)
Patient aware.

## 2018-07-16 ENCOUNTER — Telehealth: Payer: Self-pay | Admitting: Family Medicine

## 2018-07-17 ENCOUNTER — Other Ambulatory Visit: Payer: Self-pay

## 2018-07-24 ENCOUNTER — Ambulatory Visit: Payer: Managed Care, Other (non HMO) | Admitting: Family Medicine

## 2018-11-02 ENCOUNTER — Other Ambulatory Visit: Payer: Self-pay | Admitting: Family Medicine

## 2018-11-02 DIAGNOSIS — I10 Essential (primary) hypertension: Secondary | ICD-10-CM

## 2018-11-07 ENCOUNTER — Other Ambulatory Visit: Payer: Self-pay | Admitting: Family Medicine

## 2018-11-07 DIAGNOSIS — I10 Essential (primary) hypertension: Secondary | ICD-10-CM

## 2018-11-08 NOTE — Telephone Encounter (Signed)
Rakes. NTBS 30 days given 11/02/18

## 2018-12-03 ENCOUNTER — Encounter: Payer: Self-pay | Admitting: Family Medicine

## 2018-12-03 ENCOUNTER — Ambulatory Visit (INDEPENDENT_AMBULATORY_CARE_PROVIDER_SITE_OTHER): Payer: Managed Care, Other (non HMO) | Admitting: Family Medicine

## 2018-12-03 DIAGNOSIS — I10 Essential (primary) hypertension: Secondary | ICD-10-CM | POA: Diagnosis not present

## 2018-12-03 MED ORDER — LISINOPRIL 20 MG PO TABS: 20.0000 mg | ORAL_TABLET | Freq: Every day | ORAL | 0 refills | Status: DC

## 2018-12-03 MED ORDER — CHLORTHALIDONE 25 MG PO TABS: 12.5000 mg | ORAL_TABLET | Freq: Every day | ORAL | 0 refills | Status: DC

## 2018-12-03 NOTE — Progress Notes (Signed)
Virtual Visit via telephone Note Due to COVID-19 pandemic this visit was conducted virtually. This visit type was conducted due to national recommendations for restrictions regarding the COVID-19 Pandemic (e.g. social distancing, sheltering in place) in an effort to limit this patient's exposure and mitigate transmission in our community. All issues noted in this document were discussed and addressed.  A physical exam was not performed with this format.   I connected with Terry Ryan on 12/03/18 at 0750 by telephone and verified that I am speaking with the correct person using two identifiers. Terry Ryan is currently located at home and family is currently with them during visit. The provider, Kari BaarsMichelle Graeme Menees, FNP is located in their office at time of visit.  I discussed the limitations, risks, security and privacy concerns of performing an evaluation and management service by telephone and the availability of in person appointments. I also discussed with the patient that there may be a patient responsible charge related to this service. The patient expressed understanding and agreed to proceed.  Subjective:  Patient ID: Terry Ryan, male    DOB: 05-Jun-1960, 58 y.o.   MRN: [id number]  Chief Complaint:  Medical Management of Chronic Issues and Hypertension   HPI: Terry Ryan is a 58 y.o. male presenting on 12/03/2018 for Medical Management of Chronic Issues and Hypertension   Pt is following up today for hypertension. States he needs refills on his medications. States he has been checking his blood pressure at home and it is running in the 130/80 range. He denies chest pain, palpitations, swelling, shortness of breath, headaches, confusion, or visual changes.   Hypertension This is a chronic problem. The current episode started more than 1 year ago. The problem is unchanged. The problem is controlled. Pertinent negatives include no anxiety, blurred vision, chest pain, headaches,  malaise/fatigue, neck pain, orthopnea, palpitations, peripheral edema, PND, shortness of breath or sweats. Risk factors for coronary artery disease include male gender. Past treatments include diuretics and ACE inhibitors. The current treatment provides significant improvement. Compliance problems include diet.  There is no history of angina, kidney disease, CAD/MI, CVA, heart failure, left ventricular hypertrophy, PVD or retinopathy.     Relevant past medical, surgical, family, and social history reviewed and updated as indicated.  Allergies and medications reviewed and updated.   Past Medical History:  Diagnosis Date  . Hypertension   . Renal disorder    kidney stones    Past Surgical History:  Procedure Laterality Date  . cyst removed from left arm and wrist      Social History   Socioeconomic History  . Marital status: Married    Spouse name: Not on file  . Number of children: Not on file  . Years of education: Not on file  . Highest education level: Not on file  Occupational History  . Not on file  Social Needs  . Financial resource strain: Not on file  . Food insecurity    Worry: Not on file    Inability: Not on file  . Transportation needs    Medical: Not on file    Non-medical: Not on file  Tobacco Use  . Smoking status: Former Smoker    Packs/day: 1.00    Years: 20.00    Pack years: 20.00    Types: Cigarettes    Quit date: 03/07/2002    Years since quitting: 16.7  . Smokeless tobacco: Never Used  Substance and Sexual Activity  . Alcohol use: Yes  Comment: occasionally  . Drug use: No  . Sexual activity: Not on file  Lifestyle  . Physical activity    Days per week: Not on file    Minutes per session: Not on file  . Stress: Not on file  Relationships  . Social Musician on phone: Not on file    Gets together: Not on file    Attends religious service: Not on file    Active member of club or organization: Not on file    Attends meetings  of clubs or organizations: Not on file    Relationship status: Not on file  . Intimate partner violence    Fear of current or ex partner: Not on file    Emotionally abused: Not on file    Physically abused: Not on file    Forced sexual activity: Not on file  Other Topics Concern  . Not on file  Social History Narrative  . Not on file    Outpatient Encounter Medications as of 12/03/2018  Medication Sig  . chlorthalidone (HYGROTON) 25 MG tablet Take 0.5 tablets (12.5 mg total) by mouth daily.  Marland Kitchen lisinopril (ZESTRIL) 20 MG tablet Take 1 tablet (20 mg total) by mouth daily.  . [DISCONTINUED] chlorthalidone (HYGROTON) 25 MG tablet Take 1/2 (one-half) tablet by mouth once daily  . [DISCONTINUED] lisinopril (ZESTRIL) 20 MG tablet Take 1 tablet by mouth once daily   No facility-administered encounter medications on file as of 12/03/2018.     No Known Allergies  Review of Systems  Constitutional: Negative for activity change, appetite change, chills, diaphoresis, fatigue, fever, malaise/fatigue and unexpected weight change.  HENT: Negative.   Eyes: Negative.  Negative for blurred vision, photophobia and visual disturbance.  Respiratory: Negative for cough, chest tightness and shortness of breath.   Cardiovascular: Negative for chest pain, palpitations, orthopnea, leg swelling and PND.  Gastrointestinal: Negative for abdominal pain, blood in stool, constipation, diarrhea, nausea and vomiting.  Endocrine: Negative.   Genitourinary: Negative for decreased urine volume, difficulty urinating, dysuria, frequency and urgency.  Musculoskeletal: Positive for arthralgias (knee, ongoing due to fall and surgery). Negative for myalgias and neck pain.  Skin: Negative.   Allergic/Immunologic: Negative.   Neurological: Negative for dizziness, tremors, seizures, syncope, facial asymmetry, speech difficulty, weakness, light-headedness, numbness and headaches.  Hematological: Negative.    Psychiatric/Behavioral: Negative for confusion, hallucinations, sleep disturbance and suicidal ideas.  All other systems reviewed and are negative.        Observations/Objective: No vital signs or physical exam, this was a telephone or virtual health encounter.  Pt alert and oriented, answers all questions appropriately, and able to speak in full sentences.    Assessment and Plan: Rulon was seen today for medical management of chronic issues and hypertension.  Diagnoses and all orders for this visit:  Essential hypertension Pt aware he needs to make an in office appointment as his labs need to be updated. Pt reports good control of BP at home. Will not make changes today. Goal BP 130/80. Pt aware to report any persistent high or low readings. DASH diet and exercise encouraged. Exercise at least 150 minutes per week and increase as tolerated. Goal BMI > 25. Stress management encouraged. Smoking cessation discussed. Avoid excessive alcohol. Avoid NSAID's. Avoid more than 2000 mg of sodium daily. Medications as prescribed. Follow up as scheduled.  -     chlorthalidone (HYGROTON) 25 MG tablet; Take 0.5 tablets (12.5 mg total) by mouth daily. -  lisinopril (ZESTRIL) 20 MG tablet; Take 1 tablet (20 mg total) by mouth daily.     Follow Up Instructions: Return in about 4 weeks (around 12/31/2018), or if symptoms worsen or fail to improve, for Labs, HTN.    I discussed the assessment and treatment plan with the patient. The patient was provided an opportunity to ask questions and all were answered. The patient agreed with the plan and demonstrated an understanding of the instructions.   The patient was advised to call back or seek an in-person evaluation if the symptoms worsen or if the condition fails to improve as anticipated.  The above assessment and management plan was discussed with the patient. The patient verbalized understanding of and has agreed to the management plan. Patient  is aware to call the clinic if they develop any new symptoms or if symptoms persist or worsen. Patient is aware when to return to the clinic for a follow-up visit. Patient educated on when it is appropriate to go to the emergency department.    I provided 15 minutes of non-face-to-face time during this encounter. The call started at 0750. The call ended at [place]. The other time was used for coordination of care.    Monia Pouch, FNP-C [facility] [address] [place], [place] [address] [phone] 12/03/18

## 2019-01-21 ENCOUNTER — Encounter: Payer: Self-pay | Admitting: Physical Therapy

## 2019-01-21 ENCOUNTER — Other Ambulatory Visit: Payer: Self-pay

## 2019-01-21 ENCOUNTER — Ambulatory Visit: Payer: Worker's Compensation | Attending: Orthopedic Surgery | Admitting: Physical Therapy

## 2019-01-21 DIAGNOSIS — R262 Difficulty in walking, not elsewhere classified: Secondary | ICD-10-CM | POA: Diagnosis present

## 2019-01-21 DIAGNOSIS — M6281 Muscle weakness (generalized): Secondary | ICD-10-CM | POA: Insufficient documentation

## 2019-01-21 DIAGNOSIS — M25561 Pain in right knee: Secondary | ICD-10-CM | POA: Insufficient documentation

## 2019-01-21 DIAGNOSIS — M25562 Pain in left knee: Secondary | ICD-10-CM | POA: Diagnosis present

## 2019-01-21 DIAGNOSIS — G8929 Other chronic pain: Secondary | ICD-10-CM | POA: Diagnosis present

## 2019-01-21 NOTE — Therapy (Signed)
[facility] [address] [place], [place], [address] Phone: [phone]   Fax:  [phone]  Physical Therapy Evaluation  Patient Details  Name: Terry Ryan MRN: [id number] Date of Birth: 09/16/1960 Referring Provider (PT): Ranee Gosselin, MD   Encounter Date: 01/21/2019  PT End of Session - 01/21/19 1420    Visit Number  1    Number of Visits  10    Date for PT Re-Evaluation  04/01/19    Authorization Type  Worker's Compensation    Authorization - Visit Number  1    Authorization - Number of Visits  10    PT Start Time  1341    PT Stop Time  1413    PT Time Calculation (min)  32 min    Activity Tolerance  Patient tolerated treatment well    Behavior During Therapy  [facility] for tasks assessed/performed       Past Medical History:  Diagnosis Date  . Hypertension   . Renal disorder    kidney stones    Past Surgical History:  Procedure Laterality Date  . cyst removed from left arm and wrist      There were no vitals filed for this visit.   Subjective Assessment - 01/21/19 1451    Subjective  COVID-19 screening performed upon arrival.Patient arrives to physical therapy with reports of right knee pain that began after a fall at work on 11/20/2018. Patient reported he jumped and his knee gave way inward when he landed and fell. Patient reports pain and function has improved since injury but is still having difficulties with walking and has pain with certain movements. Patient reports pain at worst as 7/10 and pain at best as 1-2/10 with ibuprofen as needed. Patient's goals are to decrease pain, improve movement, improve ability to walk without pain.    Pertinent History  HTN    Limitations  Standing;Walking;House hold activities    How long can you sit comfortably?  "depends on position"    How long can you walk comfortably?  "20-30 mins"    Diagnostic tests  MRI: MCL torn from cartilage and other small tears in knee    Patient  Stated Goals  decrease pain and improve movement    Currently in Pain?  Yes    Pain Score  2     Pain Location  Knee    Pain Orientation  Right;Medial    Pain Descriptors / Indicators  Aching;Sharp    Pain Type  Chronic pain    Pain Onset  More than a month ago    Pain Frequency  Constant    Aggravating Factors   moving it in a certain way    Pain Relieving Factors  ibuprofen as needed         [facility] PT Assessment - 01/21/19 0001      Assessment   Medical Diagnosis  Pain in right knee    Referring Provider (PT)  Ranee Gosselin, MD    Onset Date/Surgical Date  11/20/18    Next MD Visit  unsure    Prior Therapy  HEP      Precautions   Precautions  None    Precaution Comments  wears knee brace for ambulation      Restrictions   Weight Bearing Restrictions  No      Balance Screen   Has the patient fallen in the past 6 months  Yes    How many times?  n  Has the patient had a decrease in activity level because of a fear of falling?   No    Is the patient reluctant to leave their home because of a fear of falling?   No      Home Public house managernvironment   Living Environment  Private residence    Living Arrangements  Spouse/significant other      Prior Function   Level of Independence  Independent with basic ADLs    Vocation  Full time employment    Vocation Requirements  standing and walking on concrete, on light duty right now      ROM / Strength   AROM / PROM / Strength  AROM;Strength      AROM   AROM Assessment Site  Knee    Right/Left Knee  Right    Right Knee Extension  0    Right Knee Flexion  130      Strength   Overall Strength  Deficits    Strength Assessment Site  Knee    Right/Left Knee  Right    Right Knee Flexion  4-/5    Right Knee Extension  4/5      Palpation   Palpation comment  increased tenderness to right medial knee      Special Tests    Special Tests  Laxity/Instability Tests    Laxity/Instability   Anterior drawer test;other      Anterior  drawer test   Findings  Negative    Side  Right      Other   Findings  Positive    side  Right    comment  Valgus Stress test a 0 degrees      Ambulation/Gait   Assistive device  Straight cane    Gait Pattern  Step-through pattern;Decreased stride length;Decreased stance time - right;Decreased step length - left;Decreased weight shift to right;Decreased hip/knee flexion - right;Antalgic                Objective measurements completed on examination: See above findings.      [facility] Adult PT Treatment/Exercise - 01/21/19 0001      Modalities   Modalities  Electrical Stimulation;Vasopneumatic      Electrical Stimulation   Electrical Stimulation Location  right medial knee    Electrical Stimulation Action  pre-mod    Electrical Stimulation Parameters  80-150 hz x10 mins    Electrical Stimulation Goals  Pain      Vasopneumatic   Number Minutes Vasopneumatic   10 minutes    Vasopnuematic Location   Knee    Vasopneumatic Pressure  Low    Vasopneumatic Temperature   40             PT Education - 01/21/19 1456    Education Details  SLR with ER, SAQ    Person(s) Educated  Patient    Methods  Explanation;Demonstration;Handout    Comprehension  Verbalized understanding;Returned demonstration          PT Long Term Goals - 01/21/19 1432      PT LONG TERM GOAL #1   Title  Patient will be independent with HEP    Time  10    Period  Weeks    Status  New      PT LONG TERM GOAL #2   Title  Patient will demonstrate 4+/5 or greater of right knee MMT to improve stability during functional tasks.    Time  10    Period  Weeks  Status  New      PT LONG TERM GOAL #3   Title  Patient will report ability to perform ADLs and home activities with right knee pain less than or equal to 3/10    Time  10    Period  Weeks    Status  New      PT LONG TERM GOAL #4   Title  Patient will report ability to walk 30+ minutes with AD and knee brace to grocery shop.    Time   10    Period  Weeks    Status  New             Plan - 01/21/19 1457    Clinical Impression Statement  Patient is a 58 year old male who presents to physical therapy with right knee pain, decreased right knee MMT, and difficulty walking. Patient (-) for right anterior drawer test. Patient (+) right knee valgus stress test at 0 degrees. Patient ambulates with a straight cane with decreased right stance time, decreased right weight shifting, decreased knee flexion during swing, and decreased left knee step length. Patient and PT discussed HEP as well as plan of care to address deficits. Patient reported understanding. Patient would benefit from skilled physical therapy to address deficits and address patient's goals.    Personal Factors and Comorbidities  Comorbidity 1    Comorbidities  HTN    Examination-Activity Limitations  Bend    Stability/Clinical Decision Making  Stable/Uncomplicated    Clinical Decision Making  Low    Rehab Potential  Good    PT Frequency  1x / week    PT Duration  Other (comment)    PT Treatment/Interventions  ADLs/Self Care Home Management;Cryotherapy;Air traffic controller;Iontophoresis 4mg /ml Dexamethasone;Therapeutic activities;Therapeutic exercise;Balance training;Neuromuscular re-education;Patient/family education;Passive range of motion;Vasopneumatic Device;Manual techniques;Taping    PT Next Visit Plan  nustep, pain free strengthening and ROM , modalities PRN for pain relief    PT Home Exercise Plan  see patient education section    Consulted and Agree with Plan of Care  Patient       Patient will benefit from skilled therapeutic intervention in order to improve the following deficits and impairments:  Pain, Decreased activity tolerance, Decreased strength, Decreased range of motion, Difficulty walking  Visit Diagnosis: Chronic pain of left knee - Plan: PT plan of care cert/re-cert  Difficulty in walking, not  elsewhere classified - Plan: PT plan of care cert/re-cert  Muscle weakness (generalized) - Plan: PT plan of care cert/re-cert     Problem List Patient Active Problem List   Diagnosis Date Noted  . Essential hypertension 12/26/2017  . Sprain of lateral collateral ligament of right knee 12/26/2017  . History of kidney stones 12/13/2017    Gabriela Eves, PT, DPT 01/21/2019, 3:14 PM  [facility] [address] [place], [place], [address] Phone: [phone]   Fax:  [phone]  Name: Terry Ryan MRN: [id number] Date of Birth: 03-Aug-1960

## 2019-01-28 ENCOUNTER — Ambulatory Visit: Payer: Worker's Compensation | Admitting: Physical Therapy

## 2019-01-30 ENCOUNTER — Ambulatory Visit: Payer: Worker's Compensation | Admitting: Physical Therapy

## 2019-01-30 ENCOUNTER — Other Ambulatory Visit: Payer: Self-pay

## 2019-01-30 ENCOUNTER — Encounter: Payer: Self-pay | Admitting: Physical Therapy

## 2019-01-30 DIAGNOSIS — G8929 Other chronic pain: Secondary | ICD-10-CM

## 2019-01-30 DIAGNOSIS — M6281 Muscle weakness (generalized): Secondary | ICD-10-CM

## 2019-01-30 DIAGNOSIS — R262 Difficulty in walking, not elsewhere classified: Secondary | ICD-10-CM

## 2019-01-30 DIAGNOSIS — M25562 Pain in left knee: Secondary | ICD-10-CM | POA: Diagnosis not present

## 2019-01-30 NOTE — Therapy (Signed)
[facility] [address] [place], [place], [address] Phone: [phone]   Fax:  [phone]  Physical Therapy Treatment  Patient Details  Name: Terry DibbleLindsey T Dubois MRN: [id number] Date of Birth: 10-14-1960 Referring Provider (PT): Ranee Gosselinonald Gioffre, MD   Encounter Date: 01/30/2019  PT End of Session - 01/30/19 0806    Visit Number  2    Number of Visits  10    Date for PT Re-Evaluation  04/01/19    Authorization Type  Worker's Compensation    Authorization - Visit Number  2    Authorization - Number of Visits  10    PT Start Time  0730    PT Stop Time  0820    PT Time Calculation (min)  50 min    Activity Tolerance  Patient tolerated treatment well    Behavior During Therapy  [facility] for tasks assessed/performed       Past Medical History:  Diagnosis Date  . Hypertension   . Renal disorder    kidney stones    Past Surgical History:  Procedure Laterality Date  . cyst removed from left arm and wrist      There were no vitals filed for this visit.  Subjective Assessment - 01/30/19 0805    Subjective  COVID-19 screening performed upon arrival. Patient arrives to therapy stating a constant 5/10 but reported more pain after IE due to increased knee flexion.    Pertinent History  HTN    Limitations  Standing;Walking;House hold activities    How long can you sit comfortably?  "depends on position"    How long can you walk comfortably?  "20-30 mins"    Diagnostic tests  MRI: MCL torn from cartilage and other small tears in knee    Patient Stated Goals  decrease pain and improve movement    Currently in Pain?  Yes    Pain Score  5     Pain Location  Knee    Pain Orientation  Right;Medial    Pain Descriptors / Indicators  Aching;Sharp    Pain Type  Chronic pain    Pain Onset  More than a month ago    Pain Frequency  Constant         [facility] PT Assessment - 01/30/19 0001      Assessment   Medical Diagnosis  Pain in right knee     Referring Provider (PT)  Ranee Gosselinonald Gioffre, MD    Onset Date/Surgical Date  11/20/18    Next MD Visit  unsure    Prior Therapy  HEP      Precautions   Precautions  None    Precaution Comments  wears knee brace for ambulation                   [facility] Adult PT Treatment/Exercise - 01/30/19 0001      Exercises   Exercises  Knee/Hip      Knee/Hip Exercises: Aerobic   Nustep  Level 3 x10 mins      Knee/Hip Exercises: Seated   Long Arc Quad  AROM;Right;2 sets;10 reps    Clamshell with TheraBand  Red   x20     Knee/Hip Exercises: Supine   Short Arc Quad Sets  AROM;Right;2 sets;10 reps    Straight Leg Raises  AROM;Right;20 reps    Other Supine Knee/Hip Exercises  ball squeeze 5" hold x20      Modalities   Modalities  Electrical Stimulation;Vasopneumatic  Acupuncturist Location  right medial knee    Electrical Stimulation Action  pre-mod    Electrical Stimulation Parameters  80-150 hz x15 mins    Electrical Stimulation Goals  Pain      Vasopneumatic   Number Minutes Vasopneumatic   15 minutes    Vasopnuematic Location   Knee    Vasopneumatic Pressure  Low    Vasopneumatic Temperature   36                  PT Long Term Goals - 01/21/19 1432      PT LONG TERM GOAL #1   Title  Patient will be independent with HEP    Time  10    Period  Weeks    Status  New      PT LONG TERM GOAL #2   Title  Patient will demonstrate 4+/5 or greater of right knee MMT to improve stability during functional tasks.    Time  10    Period  Weeks    Status  New      PT LONG TERM GOAL #3   Title  Patient will report ability to perform ADLs and home activities with right knee pain less than or equal to 3/10    Time  10    Period  Weeks    Status  New      PT LONG TERM GOAL #4   Title  Patient will report ability to walk 30+ minutes with AD and knee brace to grocery shop.    Time  10    Period  Weeks    Status  New             Plan - 01/30/19 0806    Clinical Impression Statement  Patient was able to tolerate treatment fairly well but did report slight increase of discomfort with various TEs. Patient was educated at start of session the importance of pain free ROM and if pain levels go above 6-7/10 to stop. Patient reported understanding. Patient noted with increased tenderness to medial knee upon palpation but with [facility] patella ROM. Patient educated on MCL and basica anatomy with knee model and importance of knee strengthening as tolerated to provide more stability. Patient reported understanding. Patient noted with normal response to modalities upon removal.    Personal Factors and Comorbidities  Comorbidity 1    Comorbidities  HTN    Examination-Activity Limitations  Bend    Stability/Clinical Decision Making  Stable/Uncomplicated    Clinical Decision Making  Low    Rehab Potential  Good    PT Frequency  1x / week    PT Duration  Other (comment)    PT Treatment/Interventions  ADLs/Self Care Home Management;Cryotherapy;Air traffic controller;Iontophoresis 4mg /ml Dexamethasone;Therapeutic activities;Therapeutic exercise;Balance training;Neuromuscular re-education;Patient/family education;Passive range of motion;Vasopneumatic Device;Manual techniques;Taping    PT Next Visit Plan  nustep, pain free strengthening and ROM , modalities PRN for pain relief    PT Home Exercise Plan  see patient education section    Consulted and Agree with Plan of Care  Patient       Patient will benefit from skilled therapeutic intervention in order to improve the following deficits and impairments:  Pain, Decreased activity tolerance, Decreased strength, Decreased range of motion, Difficulty walking  Visit Diagnosis: Difficulty in walking, not elsewhere classified - Plan: PT plan of care cert/re-cert  Muscle weakness (generalized) - Plan: PT plan of care cert/re-cert  Chronic pain of  right  knee - Plan: PT plan of care cert/re-cert     Problem List Patient Active Problem List   Diagnosis Date Noted  . Essential hypertension 12/26/2017  . Sprain of lateral collateral ligament of right knee 12/26/2017  . History of kidney stones 12/13/2017    Guss Bunde, PT, DPT 01/30/2019, 10:01 AM  [facility] [address] [place], [place], [address] Phone: [phone]   Fax:  [phone]  Name: KAVEON BLATZ MRN: [id number] Date of Birth: November 03, 1960

## 2019-02-04 ENCOUNTER — Other Ambulatory Visit: Payer: Self-pay

## 2019-02-04 ENCOUNTER — Encounter: Payer: Self-pay | Admitting: Physical Therapy

## 2019-02-04 ENCOUNTER — Ambulatory Visit: Payer: Worker's Compensation | Admitting: Physical Therapy

## 2019-02-04 DIAGNOSIS — R262 Difficulty in walking, not elsewhere classified: Secondary | ICD-10-CM

## 2019-02-04 DIAGNOSIS — M25561 Pain in right knee: Secondary | ICD-10-CM

## 2019-02-04 DIAGNOSIS — G8929 Other chronic pain: Secondary | ICD-10-CM

## 2019-02-04 DIAGNOSIS — M6281 Muscle weakness (generalized): Secondary | ICD-10-CM

## 2019-02-04 DIAGNOSIS — M25562 Pain in left knee: Secondary | ICD-10-CM | POA: Diagnosis not present

## 2019-02-04 NOTE — Therapy (Signed)
[facility] [address] [place], [place], [address] Phone: [phone]   Fax:  [phone]  Physical Therapy Treatment  Patient Details  Name: Terry DibbleLindsey T Ryan MRN: [id number] Date of Birth: November 06, 1960 Referring Provider (PT): Ranee Gosselinonald Gioffre, MD   Encounter Date: 02/04/2019  PT End of Session - 02/04/19 1450    Visit Number  3    Number of Visits  10    Date for PT Re-Evaluation  04/01/19    Authorization Type  Worker's Compensation    Authorization - Visit Number  3    Authorization - Number of Visits  10    PT Start Time  1430    PT Stop Time  1520    PT Time Calculation (min)  50 min    Activity Tolerance  Patient tolerated treatment well    Behavior During Therapy  [facility] for tasks assessed/performed       Past Medical History:  Diagnosis Date  . Hypertension   . Renal disorder    kidney stones    Past Surgical History:  Procedure Laterality Date  . cyst removed from left arm and wrist      There were no vitals filed for this visit.  Subjective Assessment - 02/04/19 1449    Subjective  COVID-19 screening performed upon arrival. Patient reports "about the same."    Pertinent History  HTN    Limitations  Standing;Walking;House hold activities    How long can you sit comfortably?  "depends on position"    How long can you walk comfortably?  "20-30 mins"    Diagnostic tests  MRI: MCL torn from cartilage and other small tears in knee    Patient Stated Goals  decrease pain and improve movement    Currently in Pain?  Yes    Pain Score  5     Pain Location  Knee    Pain Orientation  Right;Medial    Pain Descriptors / Indicators  Aching    Pain Type  Chronic pain    Pain Onset  More than a month ago    Pain Frequency  Constant         [facility] PT Assessment - 02/04/19 0001      Assessment   Medical Diagnosis  Pain in right knee    Referring Provider (PT)  Ranee Gosselinonald Gioffre, MD    Onset Date/Surgical Date  11/20/18    Next MD Visit  unsure    Prior Therapy  HEP      Precautions   Precautions  None    Precaution Comments  wears knee brace for ambulation                   [facility] Adult PT Treatment/Exercise - 02/04/19 0001      Exercises   Exercises  Knee/Hip      Knee/Hip Exercises: Aerobic   Nustep  Level 4 x12 mins      Knee/Hip Exercises: Standing   Heel Raises  Both;20 reps    Heel Raises Limitations  toe raises x20    Hip Flexion  AROM;Right;20 reps;Knee bent    Other Standing Knee Exercises  lateral stepping x2 minutes with yellow theraband      Knee/Hip Exercises: Supine   Short Arc Quad Sets  AROM;Right;2 sets;10 reps    Bridges  AROM;2 sets;10 reps    Straight Leg Raise with External Rotation  AROM;Right;2 sets;10 reps    Other Supine Knee/Hip Exercises  ball squeeze  5" hold  x20    Other Supine Knee/Hip Exercises  clam shells x20 red theraband      Modalities   Modalities  Electrical Stimulation;Vasopneumatic      Electrical Stimulation   Electrical Stimulation Location  right medial knee    Electrical Stimulation Action  pre-mod    Electrical Stimulation Parameters  1-10 hz x15 mins    Electrical Stimulation Goals  Pain      Vasopneumatic   Number Minutes Vasopneumatic   15 minutes    Vasopnuematic Location   Knee    Vasopneumatic Pressure  Low    Vasopneumatic Temperature   36                  PT Long Term Goals - 01/21/19 1432      PT LONG TERM GOAL #1   Title  Patient will be independent with HEP    Time  10    Period  Weeks    Status  New      PT LONG TERM GOAL #2   Title  Patient will demonstrate 4+/5 or greater of right knee MMT to improve stability during functional tasks.    Time  10    Period  Weeks    Status  New      PT LONG TERM GOAL #3   Title  Patient will report ability to perform ADLs and home activities with right knee pain less than or equal to 3/10    Time  10    Period  Weeks    Status  New      PT LONG TERM GOAL  #4   Title  Patient will report ability to walk 30+ minutes with AD and knee brace to grocery shop.    Time  10    Period  Weeks    Status  New            Plan - 02/04/19 1450    Clinical Impression Statement  Patient responded to treatment well but with ongoing right knee pain. Patient noted with good form with standing exercises after demonstration an explanation. Patient and PT discussed holding side stepping and bridges as that caused the most discomfort. No adverse affects upon removal of modalities.    Personal Factors and Comorbidities  Comorbidity 1    Comorbidities  HTN    Examination-Activity Limitations  Bend    Stability/Clinical Decision Making  Stable/Uncomplicated    Clinical Decision Making  Low    Rehab Potential  Good    PT Frequency  1x / week    PT Duration  Other (comment)    PT Treatment/Interventions  ADLs/Self Care Home Management;Cryotherapy;Lawyer;Iontophoresis 4mg /ml Dexamethasone;Therapeutic activities;Therapeutic exercise;Balance training;Neuromuscular re-education;Patient/family education;Passive range of motion;Vasopneumatic Device;Manual techniques;Taping    PT Next Visit Plan  nustep, pain free strengthening and ROM , modalities PRN for pain relief    PT Home Exercise Plan  see patient education section    Consulted and Agree with Plan of Care  Patient       Patient will benefit from skilled therapeutic intervention in order to improve the following deficits and impairments:  Pain, Decreased activity tolerance, Decreased strength, Decreased range of motion, Difficulty walking  Visit Diagnosis: Difficulty in walking, not elsewhere classified  Muscle weakness (generalized)  Chronic pain of right knee     Problem List Patient Active Problem List   Diagnosis Date Noted  . Essential hypertension 12/26/2017  . Sprain of lateral collateral  ligament of right knee 12/26/2017  . History of kidney  stones 12/13/2017    Gabriela Eves, PT, DPT  02/04/2019, 3:24 PM  [facility] [address] [place], [place], [address] Phone: [phone]   Fax:  [phone]  Name: Terry Ryan MRN: [id number] Date of Birth: 26-Oct-1960

## 2019-02-11 ENCOUNTER — Encounter: Payer: Self-pay | Admitting: Physical Therapy

## 2019-02-11 ENCOUNTER — Ambulatory Visit: Payer: Worker's Compensation | Attending: Orthopedic Surgery | Admitting: Physical Therapy

## 2019-02-11 ENCOUNTER — Other Ambulatory Visit: Payer: Self-pay

## 2019-02-11 DIAGNOSIS — R262 Difficulty in walking, not elsewhere classified: Secondary | ICD-10-CM | POA: Diagnosis not present

## 2019-02-11 DIAGNOSIS — M6281 Muscle weakness (generalized): Secondary | ICD-10-CM | POA: Diagnosis present

## 2019-02-11 DIAGNOSIS — M25561 Pain in right knee: Secondary | ICD-10-CM | POA: Diagnosis present

## 2019-02-11 DIAGNOSIS — G8929 Other chronic pain: Secondary | ICD-10-CM | POA: Insufficient documentation

## 2019-02-11 NOTE — Therapy (Signed)
[facility] [place], [place], [address] Phone: [phone]   Fax:  [phone]  Physical Therapy Treatment  Patient Details  Name: TAL KEMPKER MRN: [id number] Date of Birth: 06-08-1960 Referring Provider (PT): Latanya Maudlin, MD   Encounter Date: 02/11/2019  PT End of Session - 02/11/19 1330    Visit Number  4    Number of Visits  10    Date for PT Re-Evaluation  04/01/19    Authorization Type  Worker's Compensation    Authorization - Visit Number  4    Authorization - Number of Visits  10    PT Start Time  1700    PT Stop Time  1412    PT Time Calculation (min)  51 min    Activity Tolerance  Patient tolerated treatment well    Behavior During Therapy  [facility] for tasks assessed/performed       Past Medical History:  Diagnosis Date  . Hypertension   . Renal disorder    kidney stones    Past Surgical History:  Procedure Laterality Date  . cyst removed from left arm and wrist      There were no vitals filed for this visit.  Subjective Assessment - 02/11/19 1426    Subjective  COVID-19 screening performed upon arrival. Patient reports right knee pain is about the same. Patient to go to another doctor for a second opinion on 02/21/2019.    Pertinent History  HTN    How long can you sit comfortably?  "depends on position"    How long can you walk comfortably?  "20-30 mins"    Diagnostic tests  MRI: MCL torn from cartilage and other small tears in knee    Patient Stated Goals  decrease pain and improve movement    Currently in Pain?  Yes    Pain Score  5     Pain Location  Knee    Pain Orientation  Right;Medial    Pain Descriptors / Indicators  Aching    Pain Type  Chronic pain    Pain Onset  More than a month ago    Pain Frequency  Constant         [facility] PT Assessment - 02/11/19 0001      Assessment   Medical Diagnosis  Pain in right knee    Referring Provider (PT)  Latanya Maudlin, MD    Onset  Date/Surgical Date  11/20/18    Next MD Visit  unsure    Prior Therapy  HEP      Precautions   Precautions  None    Precaution Comments  wears knee brace for ambulation                   [facility] Adult PT Treatment/Exercise - 02/11/19 0001      Exercises   Exercises  Knee/Hip      Knee/Hip Exercises: Aerobic   Nustep  Level 5 x15 mins      Knee/Hip Exercises: Standing   Heel Raises  --    Heel Raises Limitations  --    Hip Flexion  AROM;Right;20 reps;Knee bent    Hip ADduction  AROM;3 sets;10 reps;Right    Hip Abduction  AROM;Right;3 sets;10 reps    Hip Extension  AROM;Right;3 sets;10 reps    Rocker Board  3 minutes      Knee/Hip Exercises: Supine   Short Arc Quad Sets  AROM;Right;4 sets;10 reps    Short Arc  Quad Sets Limitations  1# weight    Other Supine Knee/Hip Exercises  clam shells x30 red theraband      Knee/Hip Exercises: Sidelying   Hip ABduction  Strengthening;Right;3 sets;10 reps    Hip ABduction Limitations  1# weight      Modalities   Modalities  Electrical Stimulation;Vasopneumatic      Electrical Stimulation   Electrical Stimulation Location  right medial knee    Electrical Stimulation Action  pre-mod    Electrical Stimulation Parameters  80-150 hz x15 mins    Electrical Stimulation Goals  Pain      Vasopneumatic   Number Minutes Vasopneumatic   10 minutes    Vasopnuematic Location   Knee    Vasopneumatic Pressure  Low    Vasopneumatic Temperature   36                  PT Long Term Goals - 01/21/19 1432      PT LONG TERM GOAL #1   Title  Patient will be independent with HEP    Time  10    Period  Weeks    Status  New      PT LONG TERM GOAL #2   Title  Patient will demonstrate 4+/5 or greater of right knee MMT to improve stability during functional tasks.    Time  10    Period  Weeks    Status  New      PT LONG TERM GOAL #3   Title  Patient will report ability to perform ADLs and home activities with right knee pain  less than or equal to 3/10    Time  10    Period  Weeks    Status  New      PT LONG TERM GOAL #4   Title  Patient will report ability to walk 30+ minutes with AD and knee brace to grocery shop.    Time  10    Period  Weeks    Status  New            Plan - 02/11/19 1421    Clinical Impression Statement  Patient tolerated treatment well despite right knee pain. Patient was able to increase reps of standing exercises and was able to maintain proper form for all throughout. Patient tolerated an increase of weight to SAQ but did report pain with hip abduction in sidelying with weight. Patient instructed to stop and rest if pain occurs. When e-stim intensity was being estabilished patient reported feeling minimal to no sensation then when unit started he felt a strong intensity that was reduced to a tolerable sensation. Patient denied any further stronger sensations during the treatment; no adverse affects were found at end of session.    Personal Factors and Comorbidities  Comorbidity 1    Comorbidities  HTN    Examination-Activity Limitations  Bend    Stability/Clinical Decision Making  Stable/Uncomplicated    Clinical Decision Making  Low    Rehab Potential  Good    PT Frequency  1x / week    PT Duration  Other (comment)    PT Treatment/Interventions  ADLs/Self Care Home Management;Cryotherapy;Lawyerlectrical Stimulation;Moist Heat;Ultrasound;Gait training;Iontophoresis 4mg /ml Dexamethasone;Therapeutic activities;Therapeutic exercise;Balance training;Neuromuscular re-education;Patient/family education;Passive range of motion;Vasopneumatic Device;Manual techniques;Taping    PT Next Visit Plan  Assess goals next visit; nustep, pain free strengthening and ROM , modalities PRN for pain relief    Consulted and Agree with Plan of Care  Patient  Patient will benefit from skilled therapeutic intervention in order to improve the following deficits and impairments:  Pain, Decreased activity  tolerance, Decreased strength, Decreased range of motion, Difficulty walking  Visit Diagnosis: Difficulty in walking, not elsewhere classified  Muscle weakness (generalized)  Chronic pain of right knee     Problem List Patient Active Problem List   Diagnosis Date Noted  . Essential hypertension 12/26/2017  . Sprain of lateral collateral ligament of right knee 12/26/2017  . History of kidney stones 12/13/2017    Guss Bunde, PT, DPT 02/11/2019, 2:31 PM  [facility] [address] [place], [place], [address] Phone: [phone]   Fax:  [phone]  Name: MAINOR HELLMANN MRN: [id number] Date of Birth: 07-Nov-1960

## 2019-02-18 ENCOUNTER — Ambulatory Visit: Payer: Worker's Compensation | Attending: Orthopedic Surgery | Admitting: Physical Therapy

## 2019-02-18 ENCOUNTER — Other Ambulatory Visit: Payer: Self-pay

## 2019-02-18 DIAGNOSIS — G8929 Other chronic pain: Secondary | ICD-10-CM | POA: Insufficient documentation

## 2019-02-18 DIAGNOSIS — R262 Difficulty in walking, not elsewhere classified: Secondary | ICD-10-CM | POA: Diagnosis present

## 2019-02-18 DIAGNOSIS — M25561 Pain in right knee: Secondary | ICD-10-CM | POA: Diagnosis present

## 2019-02-18 DIAGNOSIS — M6281 Muscle weakness (generalized): Secondary | ICD-10-CM | POA: Diagnosis present

## 2019-02-18 NOTE — Therapy (Signed)
[facility] [place], [place], [address] Phone: [phone]   Fax:  [phone]  Physical Therapy Treatment  Patient Details  Name: Terry Ryan MRN: [id number] Date of Birth: 10/06/1960 Referring Provider (PT): Latanya Maudlin, MD   Encounter Date: 02/18/2019  PT End of Session - 02/18/19 1333    Visit Number  5    Number of Visits  10    Date for PT Re-Evaluation  04/01/19    Authorization Type  Worker's Compensation    Authorization - Visit Number  5    Authorization - Number of Visits  10    PT Start Time  4481    PT Stop Time  1421    PT Time Calculation (min)  48 min    Activity Tolerance  Patient tolerated treatment well    Behavior During Therapy  [facility] for tasks assessed/performed       Past Medical History:  Diagnosis Date  . Hypertension   . Renal disorder    kidney stones    Past Surgical History:  Procedure Laterality Date  . cyst removed from left arm and wrist      There were no vitals filed for this visit.  Subjective Assessment - 02/18/19 1332    Subjective  COVID-19 screening performed upon arrival. "Feels good today"    Pertinent History  HTN    Limitations  Standing;Walking;House hold activities    How long can you sit comfortably?  "depends on position"    How long can you walk comfortably?  "20-30 mins"    Diagnostic tests  MRI: MCL torn from cartilage and other small tears in knee    Patient Stated Goals  decrease pain and improve movement    Currently in Pain?  Yes   did not provide number on pain scale        [facility] PT Assessment - 02/18/19 0001      Assessment   Medical Diagnosis  Pain in right knee    Referring Provider (PT)  Latanya Maudlin, MD    Onset Date/Surgical Date  11/20/18    Next MD Visit  unsure    Prior Therapy  HEP      Precautions   Precautions  None    Precaution Comments  wears knee brace for ambulation      Strength   Right Knee Flexion  4+/5    Right Knee Extension  4/5                   [facility] Adult PT Treatment/Exercise - 02/18/19 0001      Exercises   Exercises  Knee/Hip      Knee/Hip Exercises: Aerobic   Nustep  Level 5 x15 mins      Knee/Hip Exercises: Standing   Heel Raises  Both;20 reps    Heel Raises Limitations  toe raises x20    Hip Flexion  AROM;20 reps;Knee bent;Both    Hip Abduction  AROM;Right;2 sets;10 reps      Knee/Hip Exercises: Supine   Short Arc Quad Sets  AROM;Right;4 sets;10 reps    Short Arc Quad Sets Limitations  2#     Other Supine Knee/Hip Exercises  clam shells x30 red theraband      Knee/Hip Exercises: Sidelying   Hip ABduction  Strengthening;Right;10 reps;2 sets      Modalities   Modalities  Electrical Stimulation;Vasopneumatic      Electrical Stimulation   Electrical Stimulation Location  right medial  knee    Electrical Stimulation Action  pre-mod    Electrical Stimulation Parameters  80-150 hz x15 mins    Electrical Stimulation Goals  Pain      Vasopneumatic   Number Minutes Vasopneumatic   15 minutes    Vasopnuematic Location   Knee    Vasopneumatic Pressure  Low    Vasopneumatic Temperature   36                  PT Long Term Goals - 02/18/19 1354      PT LONG TERM GOAL #1   Title  Patient will be independent with HEP    Time  10    Period  Weeks    Status  Achieved      PT LONG TERM GOAL #2   Title  Patient will demonstrate 4+/5 or greater of right knee MMT to improve stability during functional tasks.    Time  10    Period  Weeks    Status  On-going      PT LONG TERM GOAL #3   Title  Patient will report ability to perform ADLs and home activities with right knee pain less than or equal to 3/10    Time  10    Period  Weeks    Status  Achieved      PT LONG TERM GOAL #4   Title  Patient will report ability to walk 30+ minutes with AD and knee brace to grocery shop.    Time  10    Period  Weeks    Status  Achieved            Plan -  02/18/19 1355    Clinical Impression Statement  Patient was able to complete treatment with minimal reports of increased pain. Patient responded well to the progression TEs and was able to maintain proper form and knee alignment. Patient's goals are ongoing at this time. No adverse affects upon removal of modalities.    Personal Factors and Comorbidities  Comorbidity 1    Comorbidities  HTN    Examination-Activity Limitations  Bend    Stability/Clinical Decision Making  Stable/Uncomplicated    Clinical Decision Making  Low    Rehab Potential  Good    PT Frequency  1x / week    PT Duration  Other (comment)    PT Treatment/Interventions  ADLs/Self Care Home Management;Cryotherapy;Lawyerlectrical Stimulation;Moist Heat;Ultrasound;Gait training;Iontophoresis 4mg /ml Dexamethasone;Therapeutic activities;Therapeutic exercise;Balance training;Neuromuscular re-education;Patient/family education;Passive range of motion;Vasopneumatic Device;Manual techniques;Taping    PT Next Visit Plan  nustep, pain free strengthening and ROM , modalities PRN for pain relief    PT Home Exercise Plan  see patient education section    Consulted and Agree with Plan of Care  Patient       Patient will benefit from skilled therapeutic intervention in order to improve the following deficits and impairments:  Pain, Decreased activity tolerance, Decreased strength, Decreased range of motion, Difficulty walking  Visit Diagnosis: Difficulty in walking, not elsewhere classified  Muscle weakness (generalized)  Chronic pain of right knee     Problem List Patient Active Problem List   Diagnosis Date Noted  . Essential hypertension 12/26/2017  . Sprain of lateral collateral ligament of right knee 12/26/2017  . History of kidney stones 12/13/2017    Guss BundeKrystle Houston Ryan, PT, DPT 02/18/2019, 2:21 PM  [facility] [address] [place], [place], [id number] Phone: [phone]   Fax:   [phone]  Name: Terry Ryan  Terry Ryan MRN: [id number] Date of Birth: Jul 09, 1960

## 2019-02-25 ENCOUNTER — Ambulatory Visit: Payer: Worker's Compensation | Admitting: Physical Therapy

## 2019-02-25 HISTORY — PX: KNEE ARTHROSCOPY: SHX127

## 2019-02-27 ENCOUNTER — Ambulatory Visit: Payer: Worker's Compensation | Admitting: Physical Therapy

## 2019-03-12 ENCOUNTER — Other Ambulatory Visit: Payer: Self-pay

## 2019-03-12 ENCOUNTER — Encounter: Payer: Self-pay | Admitting: Physical Therapy

## 2019-03-12 ENCOUNTER — Ambulatory Visit: Payer: Worker's Compensation | Attending: Orthopedic Surgery | Admitting: Physical Therapy

## 2019-03-12 DIAGNOSIS — R262 Difficulty in walking, not elsewhere classified: Secondary | ICD-10-CM | POA: Diagnosis present

## 2019-03-12 DIAGNOSIS — M6281 Muscle weakness (generalized): Secondary | ICD-10-CM | POA: Insufficient documentation

## 2019-03-12 DIAGNOSIS — M25561 Pain in right knee: Secondary | ICD-10-CM | POA: Insufficient documentation

## 2019-03-12 DIAGNOSIS — M25661 Stiffness of right knee, not elsewhere classified: Secondary | ICD-10-CM | POA: Insufficient documentation

## 2019-03-12 NOTE — Therapy (Signed)
[facility] [place], [place], [address] Phone: [phone]   Fax:  [phone]  Physical Therapy Evaluation  Patient Details  Name: Terry Ryan MRN: [id number] Date of Birth: 1960-12-31 Referring Provider (PT): Lowella Petties, MD   Encounter Date: 03/12/2019  PT End of Session - 03/12/19 1101    Visit Number  1    Number of Visits  10    Date for PT Re-Evaluation  04/30/19    Authorization Type  Worker's Compensation, 10 approved visits    Authorization - Visit Number  1    Authorization - Number of Visits  10    PT Start Time  [phone]    PT Stop Time  1033    PT Time Calculation (min)  47 min    Equipment Utilized During Treatment  --   right knee brace   Activity Tolerance  Patient tolerated treatment well    Behavior During Therapy  [facility] for tasks assessed/performed       Past Medical History:  Diagnosis Date  . Hypertension   . Renal disorder    kidney stones    Past Surgical History:  Procedure Laterality Date  . cyst removed from left arm and wrist    . KNEE ARTHROSCOPY Right 02/25/2019    There were no vitals filed for this visit.   Subjective Assessment - 03/12/19 1056    Subjective  COVID-19 screening performed upon arrival. Patient arrives to physical therapy with reports of right knee pain, weakness, and difficulty walking due to a right knee scope on 02/25/2019. Patient reports doing well with ADLs though getting in/out of the shower takes increased time and caution. Patient reports weakness in right knee with walking greater than 20 minutes. Patient ambulating with single crutch for support particulary when the knee becomes weak. Patient utilizes knee brace only for outdoor and community ambulation; he is not required to use brace at home. Patient's pain at worst is 7/10 and pain at best is 1-2/10. Patient's goals are to decrease pain, improve movement, impove strength, and improve walking.    Pertinent History  HTN    Limitations  Standing;Walking;House hold activities    How long can you sit comfortably?  "depends on position"    How long can you walk comfortably?  ~20 minutes with single crutch for support    Patient Stated Goals  decrease pain and improve movement    Currently in Pain?  Yes    Pain Score  3     Pain Location  Knee    Pain Orientation  Right;Medial    Pain Descriptors / Indicators  Aching    Pain Type  Surgical pain    Pain Onset  1 to 4 weeks ago    Pain Frequency  Intermittent    Aggravating Factors   walking, being up on it    Pain Relieving Factors  resting, naproxen as needed    Effect of Pain on Daily Activities  walking, getting in/out of tub.         [facility] PT Assessment - 03/12/19 0001      Assessment   Medical Diagnosis  S/P right knee scope with medial meniscus tear    Referring Provider (PT)  Lowella Petties, MD    Onset Date/Surgical Date  02/25/19    Next MD Visit  03/21/2019    Prior Therapy  yes      Precautions   Precautions  Other (comment)  Precaution Comments  pain free strengthening; utilizes brace and single crutch for support      Balance Screen   Has the patient fallen in the past 6 months  No    How many times?  0    Has the patient had a decrease in activity level because of a fear of falling?   No    Is the patient reluctant to leave their home because of a fear of falling?   No      Home Public house manager residence    Living Arrangements  Spouse/significant other    Type of Home  House    Home Access  Ramped entrance;Stairs to enter    Entrance Stairs-Number of Steps  6      Prior Function   Level of Independence  Independent with basic ADLs      Observation/Other Assessments-Edema    Edema  Circumferential      Circumferential Edema   Circumferential - Right  36 cm at mid patella    Circumferential - Left   36 cm at mid patella      ROM / Strength   AROM / PROM / Strength  PROM       AROM   Right Knee Extension  2    Right Knee Flexion  106      PROM   Overall PROM   Deficits    PROM Assessment Site  Knee    Right/Left Knee  Right    Right Knee Extension  2    Right Knee Flexion  106      Strength   Overall Strength Comments  grossly assessed 3/5 for right knee and hip      Palpation   Patella mobility  decreased patella mobility in all planes but less mobility superiorly and inferiorly    Palpation comment  very tender to right knee joint line, to medial knee, and to incisional areas,      Transfers   Transfers  Independent with all Transfers      Ambulation/Gait   Assistive device  R Axillary Crutch    Gait Pattern  Decreased step length - right;Decreased stance time - right;Decreased stride length;Decreased weight shift to right;Lateral trunk lean to left    Ambulation Surface  Level;Indoor                Objective measurements completed on examination: See above findings.      [facility] Adult PT Treatment/Exercise - 03/12/19 0001      Exercises   Exercises  Knee/Hip      Knee/Hip Exercises: Aerobic   Nustep  Level 2 x10 mins      Modalities   Modalities  Vasopneumatic      Vasopneumatic   Number Minutes Vasopneumatic   15 minutes    Vasopnuematic Location   Knee    Vasopneumatic Pressure  Low    Vasopneumatic Temperature   36                  PT Long Term Goals - 03/12/19 1106      PT LONG TERM GOAL #1   Title  Patient will be independent with HEP and its progression.    Time  5    Period  Weeks    Status  New      PT LONG TERM GOAL #2   Title  Patient will demonstrate 4+/5 or greater of right knee MMT to improve  stability during functional tasks.    Time  5    Period  Weeks    Status  New      PT LONG TERM GOAL #3   Title  Patient will demonstrate 125+ degrees of right knee flexion AROM to improve ability to perform functional and work tasks.    Time  5    Period  Weeks    Status  New      PT LONG  TERM GOAL #4   Title  Patient will report ability to walk 30+ minutes without AD to perform work tasks.    Time  5    Period  Weeks    Status  New      PT LONG TERM GOAL #5   Title  Patient will report ability to perform ADLs with right knee pain less than 2/10    Time  5    Period  Weeks    Status  New             Plan - 03/12/19 1102    Clinical Impression Statement  Patient is a 59 year old male who presents to physical therapy with right knee pain, decreased right knee ROM, and difficulty walking secondary to a right knee scope on 02/25/2019. Patient noted with minimal edema in right knee in comparison to left. Patient's right knee patella mobilization decreased in all planes, particularly superiorly and inferiorly. Patient noted with tenderness to palpation to right joint line and along incisional areas. Patient provided with HEP to which patient reported understanding. Patient would benefit from skilled physical therapy to address deficits and address patient's goals.    Personal Factors and Comorbidities  Comorbidity 1    Comorbidities  HTN    Examination-Activity Limitations  Bend;Stand;Stairs;Locomotion Level;Dressing;Hygiene/Grooming    Stability/Clinical Decision Making  Stable/Uncomplicated    Clinical Decision Making  Low    Rehab Potential  Excellent    PT Frequency  2x / week    PT Duration  --   5 weeks   PT Treatment/Interventions  ADLs/Self Care Home Management;Cryotherapy;Lawyer;Iontophoresis 4mg /ml Dexamethasone;Therapeutic activities;Therapeutic exercise;Balance training;Neuromuscular re-education;Patient/family education;Passive range of motion;Vasopneumatic Device;Manual techniques;Taping    PT Next Visit Plan  nustep, pain free strengthening and ROM in supine and standing, modalities PRN for pain relief    PT Home Exercise Plan  see patient education section    Consulted and Agree with Plan of Care  Patient        Patient will benefit from skilled therapeutic intervention in order to improve the following deficits and impairments:  Pain, Decreased activity tolerance, Decreased strength, Decreased range of motion, Difficulty walking  Visit Diagnosis: Acute pain of right knee - Plan: PT plan of care cert/re-cert  Stiffness of right knee, not elsewhere classified - Plan: PT plan of care cert/re-cert  Muscle weakness (generalized) - Plan: PT plan of care cert/re-cert     Problem List Patient Active Problem List   Diagnosis Date Noted  . Essential hypertension 12/26/2017  . Sprain of lateral collateral ligament of right knee 12/26/2017  . History of kidney stones 12/13/2017    02/12/2018, PT, DPT 03/12/2019, 11:33 AM  [facility] [address] [place], [place], [place] Phone: [phone]   Fax:  [phone]  Name: Terry Ryan MRN: Flossie Dibble Date of Birth: [birth year]

## 2019-03-14 ENCOUNTER — Other Ambulatory Visit: Payer: Self-pay

## 2019-03-14 ENCOUNTER — Encounter: Payer: Self-pay | Admitting: Physical Therapy

## 2019-03-14 ENCOUNTER — Ambulatory Visit: Payer: Worker's Compensation | Admitting: Physical Therapy

## 2019-03-14 DIAGNOSIS — M25661 Stiffness of right knee, not elsewhere classified: Secondary | ICD-10-CM

## 2019-03-14 DIAGNOSIS — R262 Difficulty in walking, not elsewhere classified: Secondary | ICD-10-CM

## 2019-03-14 DIAGNOSIS — M25561 Pain in right knee: Secondary | ICD-10-CM | POA: Diagnosis not present

## 2019-03-14 DIAGNOSIS — M6281 Muscle weakness (generalized): Secondary | ICD-10-CM

## 2019-03-14 NOTE — Therapy (Signed)
[facility] [address] [place], [place], [address] Phone: [phone]   Fax:  [phone]  Physical Therapy Treatment  Patient Details  Name: Terry Ryan MRN: [id number] Date of Birth: 12-23-1960 Referring Provider (PT): Milly Jakob, MD   Encounter Date: 03/14/2019  PT End of Session - 03/14/19 0742    Visit Number  2    Number of Visits  10    Date for PT Re-Evaluation  04/30/19    Authorization Type  Worker's Compensation, 10 approved visits    Authorization - Visit Number  2    Authorization - Number of Visits  10    PT Start Time  0732    PT Stop Time  0823    PT Time Calculation (min)  51 min    Activity Tolerance  Patient tolerated treatment well    Behavior During Therapy  [facility] for tasks assessed/performed       Past Medical History:  Diagnosis Date  . Hypertension   . Renal disorder    kidney stones    Past Surgical History:  Procedure Laterality Date  . cyst removed from left arm and wrist    . KNEE ARTHROSCOPY Right 02/25/2019    There were no vitals filed for this visit.  Subjective Assessment - 03/14/19 0736    Subjective  COVID-19 screening performed upon arrival. Patient arrives with 2/10 right knee pain & ambulating without a crutch. Reports yesterday was about 5/10 pain after IE.    Pertinent History  HTN    Limitations  Standing;Walking;House hold activities    How long can you sit comfortably?  "depends on position"    How long can you walk comfortably?  ~20 minutes with single crutch for support    Diagnostic tests  MRI: MCL torn from cartilage and other small tears in knee    Patient Stated Goals  decrease pain and improve movement    Currently in Pain?  Yes    Pain Score  2     Pain Orientation  Right;Medial    Pain Descriptors / Indicators  Aching    Pain Type  Surgical pain    Pain Onset  1 to 4 weeks ago    Pain Frequency  Intermittent         [facility] PT Assessment - 03/14/19 0001       Assessment   Medical Diagnosis  S/P right knee scope with medial meniscus tear    Referring Provider (PT)  Milly Jakob, MD    Onset Date/Surgical Date  02/25/19    Next MD Visit  03/21/2019    Prior Therapy  yes      Precautions   Precautions  Other (comment)    Precaution Comments  pain free strengthening; utilizes brace and single crutch for support                   [facility] Adult PT Treatment/Exercise - 03/14/19 0001      Exercises   Exercises  Knee/Hip      Knee/Hip Exercises: Aerobic   Nustep  Level 2 x10 mins      Knee/Hip Exercises: Standing   Heel Raises  Both;20 reps    Heel Raises Limitations  toe raises x20    Hip Flexion  AROM;20 reps;Knee bent;Both    Hip Abduction  AROM;Right;2 sets;10 reps    Rocker Board  3 minutes      Knee/Hip Exercises: Supine   Heel Slides  AAROM;Right;2 sets;10 reps    Heel Slides Limitations  with knee glider      Modalities   Modalities  Vasopneumatic      Electrical Stimulation   Electrical Stimulation Location  right medial knee    Electrical Stimulation Action  pre-mod    Electrical Stimulation Parameters  80-150 hz x15 mins    Electrical Stimulation Goals  Pain      Vasopneumatic   Number Minutes Vasopneumatic   15 minutes    Vasopnuematic Location   Knee    Vasopneumatic Pressure  Low    Vasopneumatic Temperature   36      Manual Therapy   Manual Therapy  Joint mobilization    Joint Mobilization  patella mobs in all planes                  PT Long Term Goals - 03/12/19 1106      PT LONG TERM GOAL #1   Title  Patient will be independent with HEP and its progression.    Time  5    Period  Weeks    Status  New      PT LONG TERM GOAL #2   Title  Patient will demonstrate 4+/5 or greater of right knee MMT to improve stability during functional tasks.    Time  5    Period  Weeks    Status  New      PT LONG TERM GOAL #3   Title  Patient will demonstrate 125+ degrees of right knee  flexion AROM to improve ability to perform functional and work tasks.    Time  5    Period  Weeks    Status  New      PT LONG TERM GOAL #4   Title  Patient will report ability to walk 30+ minutes without AD to perform work tasks.    Time  5    Period  Weeks    Status  New      PT LONG TERM GOAL #5   Title  Patient will report ability to perform ADLs with right knee pain less than 2/10    Time  5    Period  Weeks    Status  New            Plan - 03/14/19 0753    Clinical Impression Statement  Patient tolerated treatment well with no complaints of pain just a little bit of pulling. Patient encouraged pain free strengthening to which patient reported understanding. No adverse affects upon the removal of modalities.    Personal Factors and Comorbidities  Comorbidity 1    Comorbidities  HTN    Examination-Activity Limitations  Bend;Stand;Stairs;Locomotion Level;Dressing;Hygiene/Grooming    Stability/Clinical Decision Making  Stable/Uncomplicated    Clinical Decision Making  Low    Rehab Potential  Excellent    PT Frequency  2x / week    PT Duration  Other (comment)   5 weeks   PT Treatment/Interventions  ADLs/Self Care Home Management;Cryotherapy;Lawyer;Iontophoresis 4mg /ml Dexamethasone;Therapeutic activities;Therapeutic exercise;Balance training;Neuromuscular re-education;Patient/family education;Passive range of motion;Vasopneumatic Device;Manual techniques;Taping    PT Next Visit Plan  nustep, pain free strengthening and ROM in supine and standing, modalities PRN for pain relief    PT Home Exercise Plan  see patient education section    Consulted and Agree with Plan of Care  Patient       Patient will benefit from skilled therapeutic intervention in order to improve the following  deficits and impairments:  Pain, Decreased activity tolerance, Decreased strength, Decreased range of motion, Difficulty walking  Visit  Diagnosis: Acute pain of right knee  Stiffness of right knee, not elsewhere classified  Muscle weakness (generalized)  Difficulty in walking, not elsewhere classified     Problem List Patient Active Problem List   Diagnosis Date Noted  . Essential hypertension 12/26/2017  . Sprain of lateral collateral ligament of right knee 12/26/2017  . History of kidney stones 12/13/2017    Gabriela Eves, PT, DPT 03/14/2019, 8:36 AM  [facility] [place], [place], [address] Phone: [phone]   Fax:  [phone]  Name: WINDLE HUEBERT MRN: [id number] Date of Birth: [birth year]

## 2019-03-18 ENCOUNTER — Other Ambulatory Visit: Payer: Self-pay

## 2019-03-18 ENCOUNTER — Encounter: Payer: Self-pay | Admitting: Physical Therapy

## 2019-03-18 ENCOUNTER — Ambulatory Visit: Payer: Worker's Compensation | Admitting: Physical Therapy

## 2019-03-18 DIAGNOSIS — M25561 Pain in right knee: Secondary | ICD-10-CM | POA: Diagnosis not present

## 2019-03-18 DIAGNOSIS — M25661 Stiffness of right knee, not elsewhere classified: Secondary | ICD-10-CM

## 2019-03-18 DIAGNOSIS — M6281 Muscle weakness (generalized): Secondary | ICD-10-CM

## 2019-03-18 NOTE — Therapy (Signed)
[facility] [place], [place], [address] Phone: [phone]   Fax:  [phone]  Physical Therapy Treatment  Patient Details  Name: Terry Ryan MRN: [id number] Date of Birth: May 20, 1960 Referring Provider (PT): Lowella Petties, MD   Encounter Date: 03/18/2019  PT End of Session - 03/18/19 0745    Visit Number  3    Number of Visits  10    Date for PT Re-Evaluation  04/30/19    Authorization Type  Worker's Compensation, 10 approved visits    Authorization - Visit Number  3    Authorization - Number of Visits  10    PT Start Time  [phone]    PT Stop Time  0820    PT Time Calculation (min)  49 min    Activity Tolerance  Patient tolerated treatment well    Behavior During Therapy  [facility] for tasks assessed/performed       Past Medical History:  Diagnosis Date  . Hypertension   . Renal disorder    kidney stones    Past Surgical History:  Procedure Laterality Date  . cyst removed from left arm and wrist    . KNEE ARTHROSCOPY Right 02/25/2019    There were no vitals filed for this visit.  Subjective Assessment - 03/18/19 0737    Subjective  COVID-19 screening performed upon arrival. Patient arrives with 3/10 right knee pain and states he did a little too much walking and exercising over the weekend. Reports there is a tender spot near medial incision area.    Pertinent History  HTN    Limitations  Standing;Walking;House hold activities    How long can you sit comfortably?  "depends on position"    How long can you walk comfortably?  ~20 minutes with single crutch for support    Diagnostic tests  MRI: MCL torn from cartilage and other small tears in knee    Patient Stated Goals  decrease pain and improve movement    Currently in Pain?  Yes    Pain Score  3     Pain Location  Knee    Pain Orientation  Right;Medial    Pain Descriptors / Indicators  Aching    Pain Type  Surgical pain    Pain Onset  1 to 4 weeks ago     Pain Frequency  Intermittent         [facility] PT Assessment - 03/18/19 0001      Assessment   Medical Diagnosis  S/P right knee scope with medial meniscus tear    Referring Provider (PT)  Lowella Petties, MD    Onset Date/Surgical Date  02/25/19    Next MD Visit  03/21/2019    Prior Therapy  yes      Precautions   Precautions  Other (comment)    Precaution Comments  pain free strengthening; utilizes brace and single crutch for support                   [facility] Adult PT Treatment/Exercise - 03/18/19 0001      Exercises   Exercises  Knee/Hip      Knee/Hip Exercises: Aerobic   Nustep  Level 2 x15 mins      Knee/Hip Exercises: Standing   Terminal Knee Extension  Strengthening;Right;2 sets;10 reps;Theraband    Forward Step Up  Right;2 sets;Hand Hold: 2;Step Height: 4"    Rocker Board  3 minutes      Knee/Hip Exercises:  Supine   Short Arc Quad Sets  AROM;Right;2 sets;10 reps    Short Arc Quad Sets Limitations  2#      Modalities   Modalities  Vasopneumatic      Programme researcher, broadcasting/film/video Location  right knee    Engineer, manufacturing  IFC    Electrical Stimulation Parameters  80-150 hz x15 mins    Electrical Stimulation Goals  Pain      Vasopneumatic   Number Minutes Vasopneumatic   15 minutes    Vasopnuematic Location   Knee    Vasopneumatic Pressure  Low    Vasopneumatic Temperature   60      Manual Therapy   Manual Therapy  Joint mobilization    Joint Mobilization  patella mobs in all planes                  PT Long Term Goals - 03/18/19 6256      PT LONG TERM GOAL #1   Title  Patient will be independent with HEP and its progression.    Time  5    Period  Weeks    Status  Achieved      PT LONG TERM GOAL #2   Title  Patient will demonstrate 4+/5 or greater of right knee MMT to improve stability during functional tasks.    Time  5    Period  Weeks    Status  Achieved      PT LONG TERM GOAL #3   Title   Patient will demonstrate 125+ degrees of right knee flexion AROM to improve ability to perform functional and work tasks.    Time  5    Period  Weeks    Status  On-going      PT LONG TERM GOAL #4   Title  Patient will report ability to walk 30+ minutes without AD to perform work tasks.    Time  5    Period  Weeks    Status  On-going   able to perform but with an increase of pain     PT LONG TERM GOAL #5   Title  Patient will report ability to perform ADLs with right knee pain less than 2/10    Status  On-going            Plan - 03/18/19 0813    Clinical Impression Statement  Patient responded well to therapy with minimal complaints of pain. Patient reported "pulling" with step downs but overall was able to complete without an increase of pain. Patient noted with 0-124 degrees of right knee AROM and 4+/5 MMT in both planes but still with a lack of eccentric quadriceps control. Patient to see MD for follow up on 03/21/2019. No adverse affects upon removal of modalities.    Personal Factors and Comorbidities  Comorbidity 1    Comorbidities  HTN    Examination-Activity Limitations  Bend;Stand;Stairs;Locomotion Level;Dressing;Hygiene/Grooming    Stability/Clinical Decision Making  Stable/Uncomplicated    Clinical Decision Making  Low    Rehab Potential  Excellent    PT Frequency  2x / week    PT Treatment/Interventions  ADLs/Self Care Home Management;Cryotherapy;Lawyer;Iontophoresis 4mg /ml Dexamethasone;Therapeutic activities;Therapeutic exercise;Balance training;Neuromuscular re-education;Patient/family education;Passive range of motion;Vasopneumatic Device;Manual techniques;Taping    PT Next Visit Plan  nustep, pain free strengthening and ROM in supine and standing, modalities PRN for pain relief    PT Home Exercise Plan  see patient education section  Consulted and Agree with Plan of Care  Patient       Patient will benefit  from skilled therapeutic intervention in order to improve the following deficits and impairments:  Pain, Decreased activity tolerance, Decreased strength, Decreased range of motion, Difficulty walking  Visit Diagnosis: Acute pain of right knee  Stiffness of right knee, not elsewhere classified  Muscle weakness (generalized)     Problem List Patient Active Problem List   Diagnosis Date Noted  . Essential hypertension 12/26/2017  . Sprain of lateral collateral ligament of right knee 12/26/2017  . History of kidney stones 12/13/2017    Guss Bunde, PT, DPT 03/18/2019, 9:01 AM  [facility] [address] [place], [place], [address] Phone: [phone]   Fax:  [phone]  Name: Terry Ryan MRN: [id number] Date of Birth: January 28, 1961

## 2019-03-20 ENCOUNTER — Ambulatory Visit: Payer: Worker's Compensation | Admitting: Physical Therapy

## 2019-03-20 ENCOUNTER — Encounter: Payer: Self-pay | Admitting: Physical Therapy

## 2019-03-20 ENCOUNTER — Other Ambulatory Visit: Payer: Self-pay

## 2019-03-20 DIAGNOSIS — M25561 Pain in right knee: Secondary | ICD-10-CM

## 2019-03-20 DIAGNOSIS — M6281 Muscle weakness (generalized): Secondary | ICD-10-CM

## 2019-03-20 DIAGNOSIS — R262 Difficulty in walking, not elsewhere classified: Secondary | ICD-10-CM

## 2019-03-20 DIAGNOSIS — M25661 Stiffness of right knee, not elsewhere classified: Secondary | ICD-10-CM

## 2019-03-20 NOTE — Therapy (Signed)
[facility] [address] [place], [place], [address] Phone: [phone]   Fax:  [phone]  Physical Therapy Treatment  Patient Details  Name: Terry Ryan MRN: [id number] Date of Birth: November 26, 1960 Referring Provider (PT): Milly Jakob, MD   Encounter Date: 03/20/2019  PT End of Session - 03/20/19 0747    Visit Number  4    Number of Visits  10    Date for PT Re-Evaluation  04/30/19    Authorization Type  Worker's Compensation, 10 approved visits    Authorization - Visit Number  4    Authorization - Number of Visits  10    PT Start Time  0731    PT Stop Time  0821    PT Time Calculation (min)  50 min    Activity Tolerance  Patient tolerated treatment well    Behavior During Therapy  [facility] for tasks assessed/performed       Past Medical History:  Diagnosis Date  . Hypertension   . Renal disorder    kidney stones    Past Surgical History:  Procedure Laterality Date  . cyst removed from left arm and wrist    . KNEE ARTHROSCOPY Right 02/25/2019    There were no vitals filed for this visit.  Subjective Assessment - 03/20/19 0736    Subjective  COVID-19 screening performed upon arrival. Reports kneeling on his right knee and felt a sharp pains then hours later feeling warmth.    Pertinent History  HTN    Limitations  Standing;Walking;House hold activities    How long can you sit comfortably?  "depends on position"    How long can you walk comfortably?  ~20 minutes with single crutch for support    Diagnostic tests  MRI: MCL torn from cartilage and other small tears in knee    Patient Stated Goals  decrease pain and improve movement    Currently in Pain?  Yes    Pain Score  2     Pain Location  Knee    Pain Orientation  Right;Medial    Pain Descriptors / Indicators  Aching    Pain Type  Surgical pain    Pain Onset  1 to 4 weeks ago    Pain Frequency  Intermittent         [facility] PT Assessment - 03/20/19 0001       Assessment   Medical Diagnosis  S/P right knee scope with medial meniscus tear    Referring Provider (PT)  Milly Jakob, MD    Onset Date/Surgical Date  02/25/19    Next MD Visit  03/21/2019    Prior Therapy  yes      Precautions   Precautions  Other (comment)    Precaution Comments  pain free strengthening; utilizes brace and single crutch for support                   [facility] Adult PT Treatment/Exercise - 03/20/19 0001      Exercises   Exercises  Knee/Hip      Knee/Hip Exercises: Aerobic   Nustep  Level 2 x15 mins      Knee/Hip Exercises: Standing   Lateral Step Up  Right;2 sets;10 reps;Hand Hold: 0;Step Height: 6"    Forward Step Up  Right;2 sets;10 reps;Hand Hold: 2;Step Height: 6"    Step Down  Right;2 sets;10 reps;Hand Hold: 1;Step Height: 4"    Rocker Board  3 minutes    SLS  2x30" on floor, 2x30" on airex      Knee/Hip Exercises: Supine   Heel Slides  AAROM;Right;2 sets;10 reps    Heel Slides Limitations  with knee glider    Bridges  AROM;2 sets;10 reps      Modalities   Modalities  Vasopneumatic      Programme researcher, broadcasting/film/video Location  right knee    Electrical Stimulation Action  IFC    Electrical Stimulation Parameters  80-150 hz x15 mins    Electrical Stimulation Goals  Pain      Vasopneumatic   Number Minutes Vasopneumatic   15 minutes    Vasopnuematic Location   Knee    Vasopneumatic Pressure  Low    Vasopneumatic Temperature   60                  PT Long Term Goals - 03/18/19 4163      PT LONG TERM GOAL #1   Title  Patient will be independent with HEP and its progression.    Time  5    Period  Weeks    Status  Achieved      PT LONG TERM GOAL #2   Title  Patient will demonstrate 4+/5 or greater of right knee MMT to improve stability during functional tasks.    Time  5    Period  Weeks    Status  Achieved      PT LONG TERM GOAL #3   Title  Patient will demonstrate 125+ degrees of right knee  flexion AROM to improve ability to perform functional and work tasks.    Time  5    Period  Weeks    Status  On-going      PT LONG TERM GOAL #4   Title  Patient will report ability to walk 30+ minutes without AD to perform work tasks.    Time  5    Period  Weeks    Status  On-going   able to perform but with an increase of pain     PT LONG TERM GOAL #5   Title  Patient will report ability to perform ADLs with right knee pain less than 2/10    Status  On-going            Plan - 03/20/19 0807    Clinical Impression Statement  Patient responded well to therapy session with some complaints of pulling in the right quad and VMO. Patient unable to perform 6" step downs without discomfort therefore 4" step was performed. No adverse affects upon removal of modalities. Patient to see MD for follow up tomorrow.    Personal Factors and Comorbidities  Comorbidity 1    Comorbidities  HTN    Examination-Activity Limitations  Bend;Stand;Stairs;Locomotion Level;Dressing;Hygiene/Grooming    Stability/Clinical Decision Making  Stable/Uncomplicated    Clinical Decision Making  Low    Rehab Potential  Excellent    PT Frequency  2x / week    PT Duration  Other (comment)    PT Treatment/Interventions  ADLs/Self Care Home Management;Cryotherapy;Lawyer;Iontophoresis 4mg /ml Dexamethasone;Therapeutic activities;Therapeutic exercise;Balance training;Neuromuscular re-education;Patient/family education;Passive range of motion;Vasopneumatic Device;Manual techniques;Taping    PT Next Visit Plan  nustep, pain free strengthening and ROM in supine and standing, modalities PRN for pain relief    PT Home Exercise Plan  see patient education section    Consulted and Agree with Plan of Care  Patient       Patient will benefit from  skilled therapeutic intervention in order to improve the following deficits and impairments:  Pain, Decreased activity tolerance,  Decreased strength, Decreased range of motion, Difficulty walking  Visit Diagnosis: Acute pain of right knee  Stiffness of right knee, not elsewhere classified  Muscle weakness (generalized)  Difficulty in walking, not elsewhere classified     Problem List Patient Active Problem List   Diagnosis Date Noted  . Essential hypertension 12/26/2017  . Sprain of lateral collateral ligament of right knee 12/26/2017  . History of kidney stones 12/13/2017    Gabriela Eves PT, DPT 03/20/2019, 8:32 AM  [facility] [address] [place], [place], [address] Phone: [phone]   Fax:  [phone]  Name: TYREE FLUHARTY MRN: [id number] Date of Birth: 1960-12-03

## 2019-03-25 ENCOUNTER — Other Ambulatory Visit: Payer: Self-pay

## 2019-03-25 ENCOUNTER — Ambulatory Visit: Payer: Worker's Compensation | Admitting: Physical Therapy

## 2019-03-25 DIAGNOSIS — M6281 Muscle weakness (generalized): Secondary | ICD-10-CM

## 2019-03-25 DIAGNOSIS — M25561 Pain in right knee: Secondary | ICD-10-CM

## 2019-03-25 DIAGNOSIS — M25661 Stiffness of right knee, not elsewhere classified: Secondary | ICD-10-CM

## 2019-03-25 DIAGNOSIS — R262 Difficulty in walking, not elsewhere classified: Secondary | ICD-10-CM

## 2019-03-25 NOTE — Therapy (Signed)
[facility] [place], [place], [address] Phone: [phone]   Fax:  [phone]  Physical Therapy Treatment  Patient Details  Name: Terry Ryan MRN: [id number] Date of Birth: [birth year] Referring Provider (PT): Lowella Petties, MD   Encounter Date: 03/25/2019  PT End of Session - 03/25/19 1419    Visit Number  5    Number of Visits  10    Date for PT Re-Evaluation  04/30/19    Authorization Type  Worker's Compensation, 10 approved visits    Authorization - Visit Number  5    Authorization - Number of Visits  10    PT Start Time  6962    PT Stop Time  1436    PT Time Calculation (min)  51 min    Activity Tolerance  Patient tolerated treatment well    Behavior During Therapy  [facility] for tasks assessed/performed       Past Medical History:  Diagnosis Date  . Hypertension   . Renal disorder    kidney stones    Past Surgical History:  Procedure Laterality Date  . cyst removed from left arm and wrist    . KNEE ARTHROSCOPY Right 02/25/2019    There were no vitals filed for this visit.  Subjective Assessment - 03/25/19 1414    Subjective  COVID-19 screening performed upon arrival. Reports MD follow up went well and he is returning to light duty at work. Returns for another follow up Apr 09, 2019.    Pertinent History  HTN    Limitations  Standing;Walking;House hold activities    How long can you sit comfortably?  "depends on position" at IE: 10-15 mins 03/25/2019    How long can you walk comfortably?  ~20 minutes with single crutch for support at IE; 35-40 mins 03/25/2019    Diagnostic tests  MRI: MCL torn from cartilage and other small tears in knee    Patient Stated Goals  decrease pain and improve movement    Currently in Pain?  Yes    Pain Score  1     Pain Location  Knee    Pain Orientation  Right    Pain Descriptors / Indicators  Aching    Pain Type  Surgical pain    Pain Onset  1 to 4 weeks ago    Pain  Frequency  Intermittent         [facility] PT Assessment - 03/25/19 0001      Assessment   Medical Diagnosis  S/P right knee scope with medial meniscus tear    Referring Provider (PT)  Lowella Petties, MD    Onset Date/Surgical Date  02/25/19    Next MD Visit  04/09/2019    Prior Therapy  yes      Precautions   Precautions  Other (comment)    Precaution Comments  pain free strengthening; utilizes brace and single crutch for support                   [facility] Adult PT Treatment/Exercise - 03/25/19 0001      Exercises   Exercises  Knee/Hip      Knee/Hip Exercises: Aerobic   Nustep  Level 4-6 x15 mins      Knee/Hip Exercises: Standing   Forward Step Up  Right;2 sets;10 reps;Hand Hold: 0;Hand Hold: 1;Step Height: 8"   x10 with UE support, x10 without UE support   Step Down  Right;2 sets;10 reps;Step Height: 4";Hand  Hold: 1   x10 with UE support; x10 without UE support.    Rocker Board  3 minutes      Knee/Hip Exercises: Supine   Single Leg Bridge  Other (comment)   attempted but reports of cramping, terminated   Straight Leg Raises  Strengthening;Right;2 sets;10 reps    Straight Leg Raises Limitations  2#      Knee/Hip Exercises: Sidelying   Hip ABduction  Strengthening;Right;10 reps;2 sets    Hip ABduction Limitations  2#      Modalities   Modalities  Vasopneumatic      Electrical Stimulation   Electrical Stimulation Location  right knee    Electrical Stimulation Action  IFC    Electrical Stimulation Parameters  80-150 hz x10 mins    Electrical Stimulation Goals  Pain      Vasopneumatic   Number Minutes Vasopneumatic   10 minutes    Vasopnuematic Location   Knee    Vasopneumatic Pressure  Low    Vasopneumatic Temperature   54                  PT Long Term Goals - 03/18/19 2671      PT LONG TERM GOAL #1   Title  Patient will be independent with HEP and its progression.    Time  5    Period  Weeks    Status  Achieved      PT LONG TERM GOAL  #2   Title  Patient will demonstrate 4+/5 or greater of right knee MMT to improve stability during functional tasks.    Time  5    Period  Weeks    Status  Achieved      PT LONG TERM GOAL #3   Title  Patient will demonstrate 125+ degrees of right knee flexion AROM to improve ability to perform functional and work tasks.    Time  5    Period  Weeks    Status  On-going      PT LONG TERM GOAL #4   Title  Patient will report ability to walk 30+ minutes without AD to perform work tasks.    Time  5    Period  Weeks    Status  On-going   able to perform but with an increase of pain     PT LONG TERM GOAL #5   Title  Patient will report ability to perform ADLs with right knee pain less than 2/10    Status  On-going            Plan - 03/25/19 1433    Clinical Impression Statement  Patient responded well with progression of exercises with minimal reports of pain with standing TEs. Patient responded well to the addition of weights but was not able to perform a single leg bridge due to right hamstring cramping. Patient reported cramp dissipated after stretching. Patient to attend 1x per week due to returning to light duty work with emphasis on HEP. No adverse affects upon removal of modalties .    Personal Factors and Comorbidities  Comorbidity 1    Comorbidities  HTN    Examination-Activity Limitations  Bend;Stand;Stairs;Locomotion Level;Dressing;Hygiene/Grooming    Stability/Clinical Decision Making  Stable/Uncomplicated    Clinical Decision Making  Low    Rehab Potential  Excellent    PT Frequency  2x / week    PT Duration  Other (comment)    PT Treatment/Interventions  ADLs/Self Care Home Management;Cryotherapy;Lawyer;Iontophoresis  4mg /ml Dexamethasone;Therapeutic activities;Therapeutic exercise;Balance training;Neuromuscular re-education;Patient/family education;Passive range of motion;Vasopneumatic Device;Manual techniques;Taping     PT Next Visit Plan  nustep, pain free strengthening and ROM in supine and standing, modalities PRN for pain relief    PT Home Exercise Plan  see patient education section    Consulted and Agree with Plan of Care  Patient       Patient will benefit from skilled therapeutic intervention in order to improve the following deficits and impairments:  Pain, Decreased activity tolerance, Decreased strength, Decreased range of motion, Difficulty walking  Visit Diagnosis: Acute pain of right knee  Stiffness of right knee, not elsewhere classified  Muscle weakness (generalized)  Difficulty in walking, not elsewhere classified     Problem List Patient Active Problem List   Diagnosis Date Noted  . Essential hypertension 12/26/2017  . Sprain of lateral collateral ligament of right knee 12/26/2017  . History of kidney stones 12/13/2017    02/12/2018, PT, DPT 03/25/2019, 2:46 PM  [facility] [address] [place], [place], [place] Phone: [phone]   Fax:  [phone]  Name: Terry Ryan MRN: Flossie Dibble Date of Birth: [birth year]

## 2019-04-01 ENCOUNTER — Other Ambulatory Visit: Payer: Self-pay

## 2019-04-01 ENCOUNTER — Encounter: Payer: Self-pay | Admitting: Physical Therapy

## 2019-04-01 ENCOUNTER — Ambulatory Visit: Payer: Worker's Compensation | Admitting: Physical Therapy

## 2019-04-01 DIAGNOSIS — M25561 Pain in right knee: Secondary | ICD-10-CM | POA: Diagnosis not present

## 2019-04-01 DIAGNOSIS — M25661 Stiffness of right knee, not elsewhere classified: Secondary | ICD-10-CM

## 2019-04-01 DIAGNOSIS — M6281 Muscle weakness (generalized): Secondary | ICD-10-CM

## 2019-04-01 DIAGNOSIS — R262 Difficulty in walking, not elsewhere classified: Secondary | ICD-10-CM

## 2019-04-01 NOTE — Therapy (Signed)
[facility] [address] [place], [place], [address] Phone: [phone]   Fax:  [phone]  Physical Therapy Treatment  Patient Details  Name: Terry Ryan MRN: [id number] Date of Birth: 07/17/1960 Referring Provider (PT): Milly Jakob, MD   Encounter Date: 04/01/2019  PT End of Session - 04/01/19 1549    Visit Number  6    Number of Visits  10    Date for PT Re-Evaluation  04/30/19    Authorization Type  Worker's Compensation, 10 approved visits    Authorization - Visit Number  6    Authorization - Number of Visits  10    PT Start Time  1345    PT Stop Time  1435    PT Time Calculation (min)  50 min    Activity Tolerance  Patient tolerated treatment well    Behavior During Therapy  [facility] for tasks assessed/performed       Past Medical History:  Diagnosis Date  . Hypertension   . Renal disorder    kidney stones    Past Surgical History:  Procedure Laterality Date  . cyst removed from left arm and wrist    . KNEE ARTHROSCOPY Right 02/25/2019    There were no vitals filed for this visit.  Subjective Assessment - 04/01/19 1544    Subjective  COVID-19 screening performed upon arrival. Patient reports feeling good and has been compliant with HEP. Patient reports switching his brace which helped a lot.    Pertinent History  HTN    Limitations  Standing;Walking;House hold activities    How long can you sit comfortably?  "depends on position" at IE: 10-15 mins 03/25/2019    How long can you walk comfortably?  ~20 minutes with single crutch for support at IE; 35-40 mins 03/25/2019    Diagnostic tests  MRI: MCL torn from cartilage and other small tears in knee    Patient Stated Goals  decrease pain and improve movement    Currently in Pain?  Yes   "little bit"                      [facility] Adult PT Treatment/Exercise - 04/01/19 0001      Exercises   Exercises  Knee/Hip      Knee/Hip Exercises: Aerobic    Recumbent Bike  Level 3 x5 mins    Nustep  Level 5-6 x10 mins      Knee/Hip Exercises: Machines for Strengthening   Cybex Leg Press  1 plate 4R15 single leg      Knee/Hip Exercises: Standing   Lateral Step Up  Right;10 reps;Hand Hold: 0;3 sets;Step Height: 8"    Forward Step Up  Right;10 reps;Hand Hold: 0;Hand Hold: 1;Step Height: 8";3 sets    Step Down  Right;10 reps;Hand Hold: 1;Step Height: 8";3 sets    Rocker Board  3 minutes      Knee/Hip Exercises: Supine   Straight Leg Raises  Strengthening;Right;2 sets;10 reps    Straight Leg Raises Limitations  3#      Knee/Hip Exercises: Sidelying   Hip ABduction  Strengthening;Right;10 reps;3 sets    Hip ABduction Limitations  3#      Modalities   Modalities  Vasopneumatic;Electrical Stimulation      Electrical Stimulation   Electrical Stimulation Location  right knee    Electrical Stimulation Action  IFC    Electrical Stimulation Parameters  80-150 hz x10 min    Electrical Stimulation Goals  Pain;Edema      Vasopneumatic   Number Minutes Vasopneumatic   10 minutes    Vasopnuematic Location   Knee    Vasopneumatic Pressure  Low    Vasopneumatic Temperature   54                  PT Long Term Goals - 03/18/19 0488      PT LONG TERM GOAL #1   Title  Patient will be independent with HEP and its progression.    Time  5    Period  Weeks    Status  Achieved      PT LONG TERM GOAL #2   Title  Patient will demonstrate 4+/5 or greater of right knee MMT to improve stability during functional tasks.    Time  5    Period  Weeks    Status  Achieved      PT LONG TERM GOAL #3   Title  Patient will demonstrate 125+ degrees of right knee flexion AROM to improve ability to perform functional and work tasks.    Time  5    Period  Weeks    Status  On-going      PT LONG TERM GOAL #4   Title  Patient will report ability to walk 30+ minutes without AD to perform work tasks.    Time  5    Period  Weeks    Status  On-going    able to perform but with an increase of pain     PT LONG TERM GOAL #5   Title  Patient will report ability to perform ADLs with right knee pain less than 2/10    Status  On-going            Plan - 04/01/19 1550    Clinical Impression Statement  Patient tolerated treatment well with no reports of pain just "pulling" along the right knee. Patient was able to perfrom 8" step ups with no UE support with no complaints. Patient still has an area of swelling along medial and lateral joint lines and patient was instructed to continue to ice and elevate. Patient reported understanding. No adverse affects upon removal of modalities.    Personal Factors and Comorbidities  Comorbidity 1    Comorbidities  HTN    Examination-Activity Limitations  Bend;Stand;Stairs;Locomotion Level;Dressing;Hygiene/Grooming    Stability/Clinical Decision Making  Stable/Uncomplicated    Clinical Decision Making  Low    Rehab Potential  Excellent    PT Frequency  2x / week    PT Duration  Other (comment)    PT Treatment/Interventions  ADLs/Self Care Home Management;Cryotherapy;Lawyer;Iontophoresis 4mg /ml Dexamethasone;Therapeutic activities;Therapeutic exercise;Balance training;Neuromuscular re-education;Patient/family education;Passive range of motion;Vasopneumatic Device;Manual techniques;Taping    PT Next Visit Plan  nustep, pain free strengthening and ROM in supine and standing, modalities PRN for pain relief    PT Home Exercise Plan  see patient education section    Consulted and Agree with Plan of Care  Patient       Patient will benefit from skilled therapeutic intervention in order to improve the following deficits and impairments:  Pain, Decreased activity tolerance, Decreased strength, Decreased range of motion, Difficulty walking  Visit Diagnosis: Stiffness of right knee, not elsewhere classified  Acute pain of right knee  Muscle weakness  (generalized)  Difficulty in walking, not elsewhere classified     Problem List Patient Active Problem List   Diagnosis Date Noted  . Essential hypertension 12/26/2017  .  Sprain of lateral collateral ligament of right knee 12/26/2017  . History of kidney stones 12/13/2017    Gabriela Eves, PT, DPT 04/01/2019, 3:56 PM  [facility] [address] [place], [place], [address] Phone: [phone]   Fax:  [phone]  Name: KOUA DEEG MRN: [id number] Date of Birth: May 19, 1960

## 2019-04-08 ENCOUNTER — Encounter: Payer: Self-pay | Admitting: Physical Therapy

## 2019-04-08 ENCOUNTER — Ambulatory Visit: Payer: Worker's Compensation | Attending: Orthopedic Surgery | Admitting: Physical Therapy

## 2019-04-08 ENCOUNTER — Other Ambulatory Visit: Payer: Self-pay

## 2019-04-08 DIAGNOSIS — R262 Difficulty in walking, not elsewhere classified: Secondary | ICD-10-CM

## 2019-04-08 DIAGNOSIS — M6281 Muscle weakness (generalized): Secondary | ICD-10-CM | POA: Diagnosis present

## 2019-04-08 DIAGNOSIS — M25561 Pain in right knee: Secondary | ICD-10-CM | POA: Insufficient documentation

## 2019-04-08 DIAGNOSIS — M25661 Stiffness of right knee, not elsewhere classified: Secondary | ICD-10-CM

## 2019-04-08 NOTE — Therapy (Addendum)
[facility] [place], [place], [address] Phone: [phone]   Fax:  [phone]  Physical Therapy Treatment PHYSICAL THERAPY DISCHARGE SUMMARY  Visits from Start of Care: 7  Current functional level related to goals / functional outcomes: See below   Remaining deficits: See goals   Education / Equipment: HEP Plan: Patient agrees to discharge.  Patient goals were met. Patient is being discharged due to meeting the stated rehab goals.  ?????    Gabriela Eves, PT, DPT   Patient Details  Name: Terry Ryan MRN: [id number] Date of Birth: [birth year] Referring Provider (PT): Lowella Petties, MD   Encounter Date: 04/08/2019  PT End of Session - 04/08/19 1400    Visit Number  7    Number of Visits  10    Date for PT Re-Evaluation  04/30/19    Authorization Type  Worker's Compensation, 10 approved visits    Authorization - Visit Number  7    Authorization - Number of Visits  10    PT Start Time  1345    PT Stop Time  1431    PT Time Calculation (min)  46 min    Activity Tolerance  Patient tolerated treatment well    Behavior During Therapy  WFL for tasks assessed/performed       Past Medical History:  Diagnosis Date  . Hypertension   . Renal disorder    kidney stones    Past Surgical History:  Procedure Laterality Date  . cyst removed from left arm and wrist    . KNEE ARTHROSCOPY Right 02/25/2019    There were no vitals filed for this visit.  Subjective Assessment - 04/08/19 1358    Subjective  COVID 19 screening performed on patient upon arrival. Patient reports that he returns to MD tomorrow. Has no functional limitations at home but still limited to desk duty at work.    Pertinent History  HTN    Limitations  Standing;Walking;House hold activities    How long can you sit comfortably?  "depends on position" at IE: 10-15 mins 03/25/2019    How long can you walk comfortably?  ~20 minutes with single  crutch for support at IE; 35-40 mins 03/25/2019    Diagnostic tests  MRI: MCL torn from cartilage and other small tears in knee    Patient Stated Goals  decrease pain and improve movement    Currently in Pain?  Yes    Pain Score  1     Pain Location  Knee    Pain Orientation  Right    Pain Descriptors / Indicators  Discomfort    Pain Type  Surgical pain    Pain Onset  1 to 4 weeks ago    Pain Frequency  Intermittent         [facility] PT Assessment - 04/08/19 0001      Assessment   Medical Diagnosis  S/P right knee scope with medial meniscus tear    Referring Provider (PT)  Lowella Petties, MD    Onset Date/Surgical Date  02/25/19    Next MD Visit  04/09/2019    Prior Therapy  yes      Precautions   Precautions  Other (comment)    Precaution Comments  pain free strengthening; utilizes brace and single crutch for support      ROM / Strength   AROM / PROM / Strength  AROM      AROM   Overall AROM  Within functional limits for tasks performed    AROM Assessment Site  Knee    Right/Left Knee  Right    Right Knee Flexion  125                   [facility] Adult PT Treatment/Exercise - 04/08/19 0001      Knee/Hip Exercises: Aerobic   Recumbent Bike  Level 3 x5 mins    Nustep  Level 5-6 x10 mins      Knee/Hip Exercises: Machines for Strengthening   Cybex Leg Press  2 pl, seat 6 x20 reps with ball squeeze      Knee/Hip Exercises: Standing   Terminal Knee Extension  Strengthening;Right;2 sets;10 reps;Theraband    Theraband Level (Terminal Knee Extension)  Level 3 (Green)    Forward Step Up  Right;2 sets;10 reps;Hand Hold: 2;Step Height: 6"    Step Down  Right;2 sets;10 reps;Hand Hold: 2;Step Height: 6"      Knee/Hip Exercises: Supine   Straight Leg Raises  Strengthening;Right;2 sets;10 reps    Straight Leg Raises Limitations  3#      Knee/Hip Exercises: Sidelying   Hip ABduction  Strengthening;Right;10 reps;3 sets    Hip ABduction Limitations  3#      Modalities    Modalities  Vasopneumatic;Electrical Stimulation      Electrical Stimulation   Electrical Stimulation Location  R knee    Electrical Stimulation Action  IFC    Electrical Stimulation Parameters  80-150 hz x10 min    Electrical Stimulation Goals  Pain      Vasopneumatic   Number Minutes Vasopneumatic   10 minutes    Vasopnuematic Location   Knee    Vasopneumatic Pressure  Low    Vasopneumatic Temperature   54                  PT Long Term Goals - 04/08/19 1405      PT LONG TERM GOAL #1   Title  Patient will be independent with HEP and its progression.    Time  5    Period  Weeks    Status  Achieved      PT LONG TERM GOAL #2   Title  Patient will demonstrate 4+/5 or greater of right knee MMT to improve stability during functional tasks.    Time  5    Period  Weeks    Status  Achieved      PT LONG TERM GOAL #3   Title  Patient will demonstrate 125+ degrees of right knee flexion AROM to improve ability to perform functional and work tasks.    Time  5    Period  Weeks    Status  Achieved      PT LONG TERM GOAL #4   Title  Patient will report ability to walk 30+ minutes without AD to perform work tasks.    Time  5    Period  Weeks    Status  Achieved   able to perform but with an increase of pain     PT LONG TERM GOAL #5   Title  Patient will report ability to perform ADLs with right knee pain less than 2/10    Status  Achieved            Plan - 04/08/19 1429    Clinical Impression Statement  Patient presented in clinic with minimal R knee pain which he related to weather. Patient able to complete all therex  and denies any limitations at home but is limited with work to sitting work duties. Has met all goals set at evaluation. Does wear velcro brace intermittantly but not at home. Patient reports experiencing inferior R knee pain at times along patellar tendon region. Normal modalities response noted following removal of the modalities.    Personal Factors  and Comorbidities  Comorbidity 1    Comorbidities  HTN    Examination-Activity Limitations  Bend;Stand;Stairs;Locomotion Level;Dressing;Hygiene/Grooming    Stability/Clinical Decision Making  Stable/Uncomplicated    Rehab Potential  Excellent    PT Frequency  2x / week    PT Duration  Other (comment)    PT Treatment/Interventions  ADLs/Self Care Home Management;Cryotherapy;Air traffic controller;Iontophoresis 35m/ml Dexamethasone;Therapeutic activities;Therapeutic exercise;Balance training;Neuromuscular re-education;Patient/family education;Passive range of motion;Vasopneumatic Device;Manual techniques;Taping    PT Next Visit Plan  Continue at MD discretion.    PT Home Exercise Plan  see patient education section    Consulted and Agree with Plan of Care  Patient       Patient will benefit from skilled therapeutic intervention in order to improve the following deficits and impairments:  Pain, Decreased activity tolerance, Decreased strength, Decreased range of motion, Difficulty walking  Visit Diagnosis: Stiffness of right knee, not elsewhere classified  Acute pain of right knee  Muscle weakness (generalized)  Difficulty in walking, not elsewhere classified     Problem List Patient Active Problem List   Diagnosis Date Noted  . Essential hypertension 12/26/2017  . Sprain of lateral collateral ligament of right knee 12/26/2017  . History of kidney stones 12/13/2017    KStandley Brooking PTA 04/08/2019, 2:41 PM  [facility] [address]Phone: [phone]  Fax:  [phone] Name: Terry EAKLEMRN: [id number] of Birth: [birth year]

## 2020-06-17 IMAGING — DX DG KNEE 1-2V*R*
2 series · 2 of 2 positions shown · non-contrast
Comparison: None.

CLINICAL DATA: Recent fall, now with right knee pain

EXAM:
RIGHT KNEE - 1-2 VIEW

[knee ap]
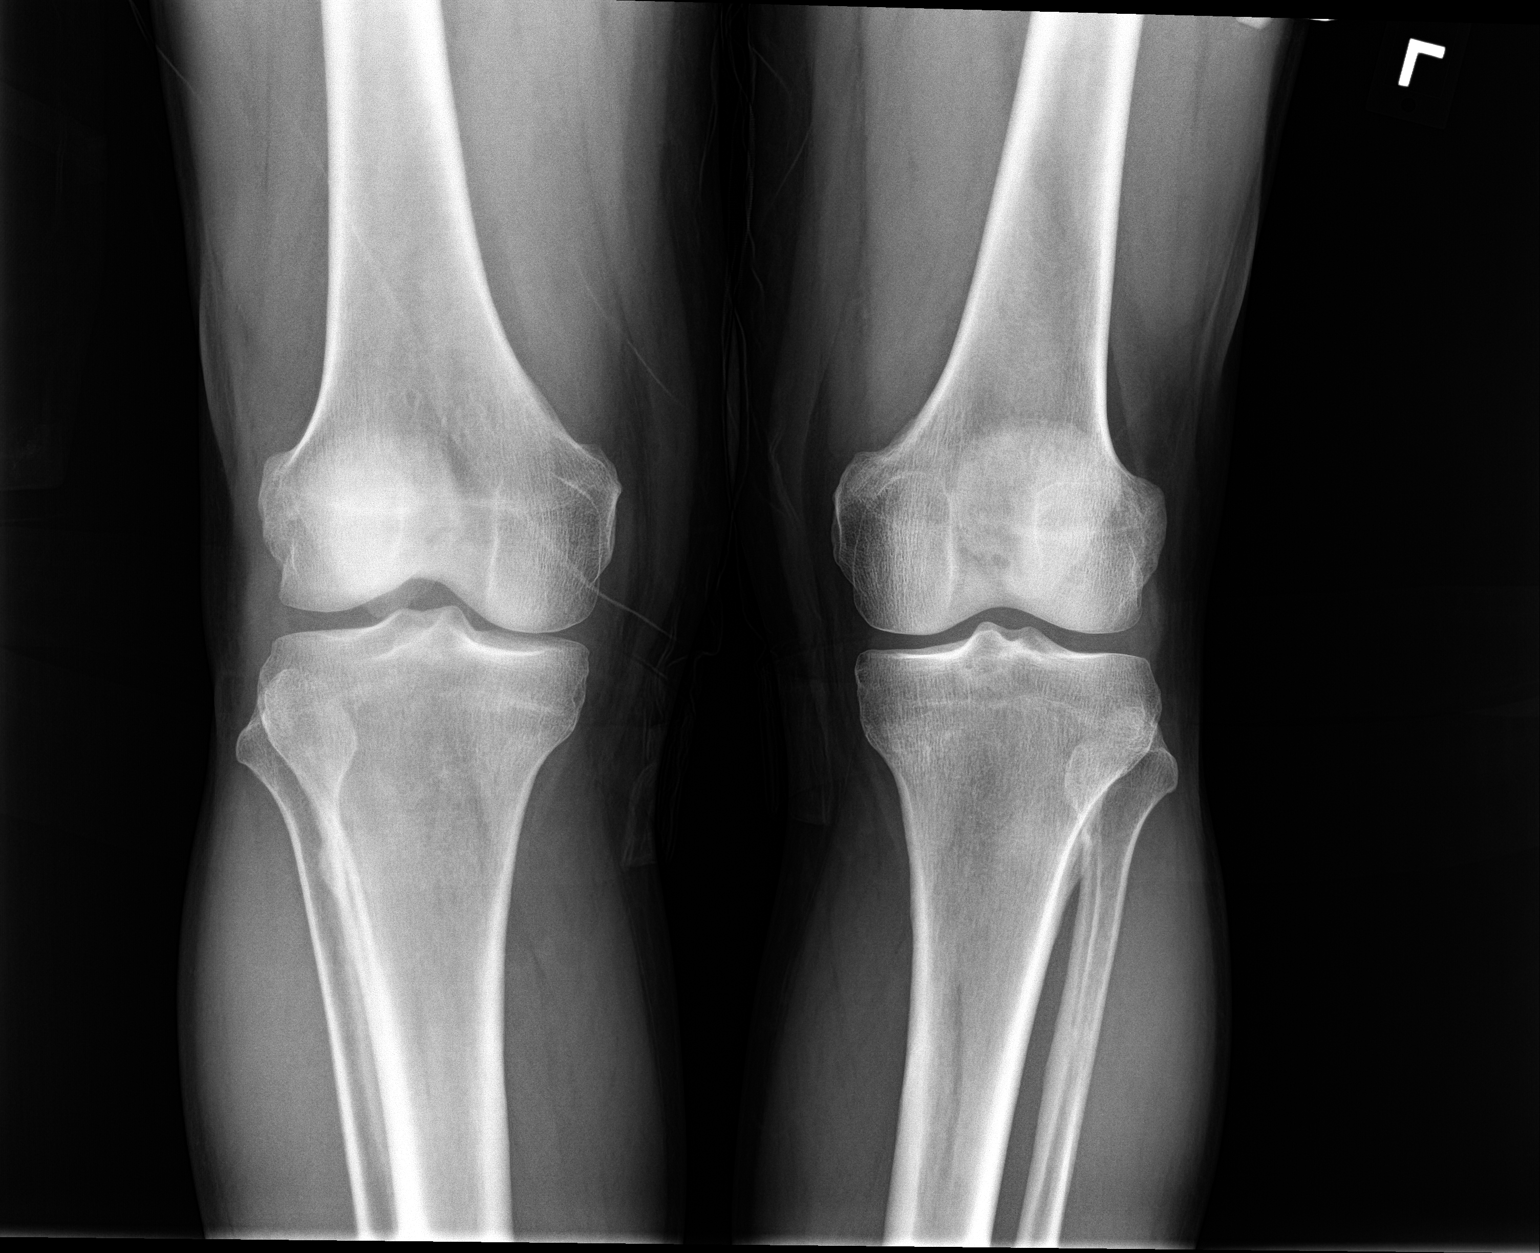

[knee lat]
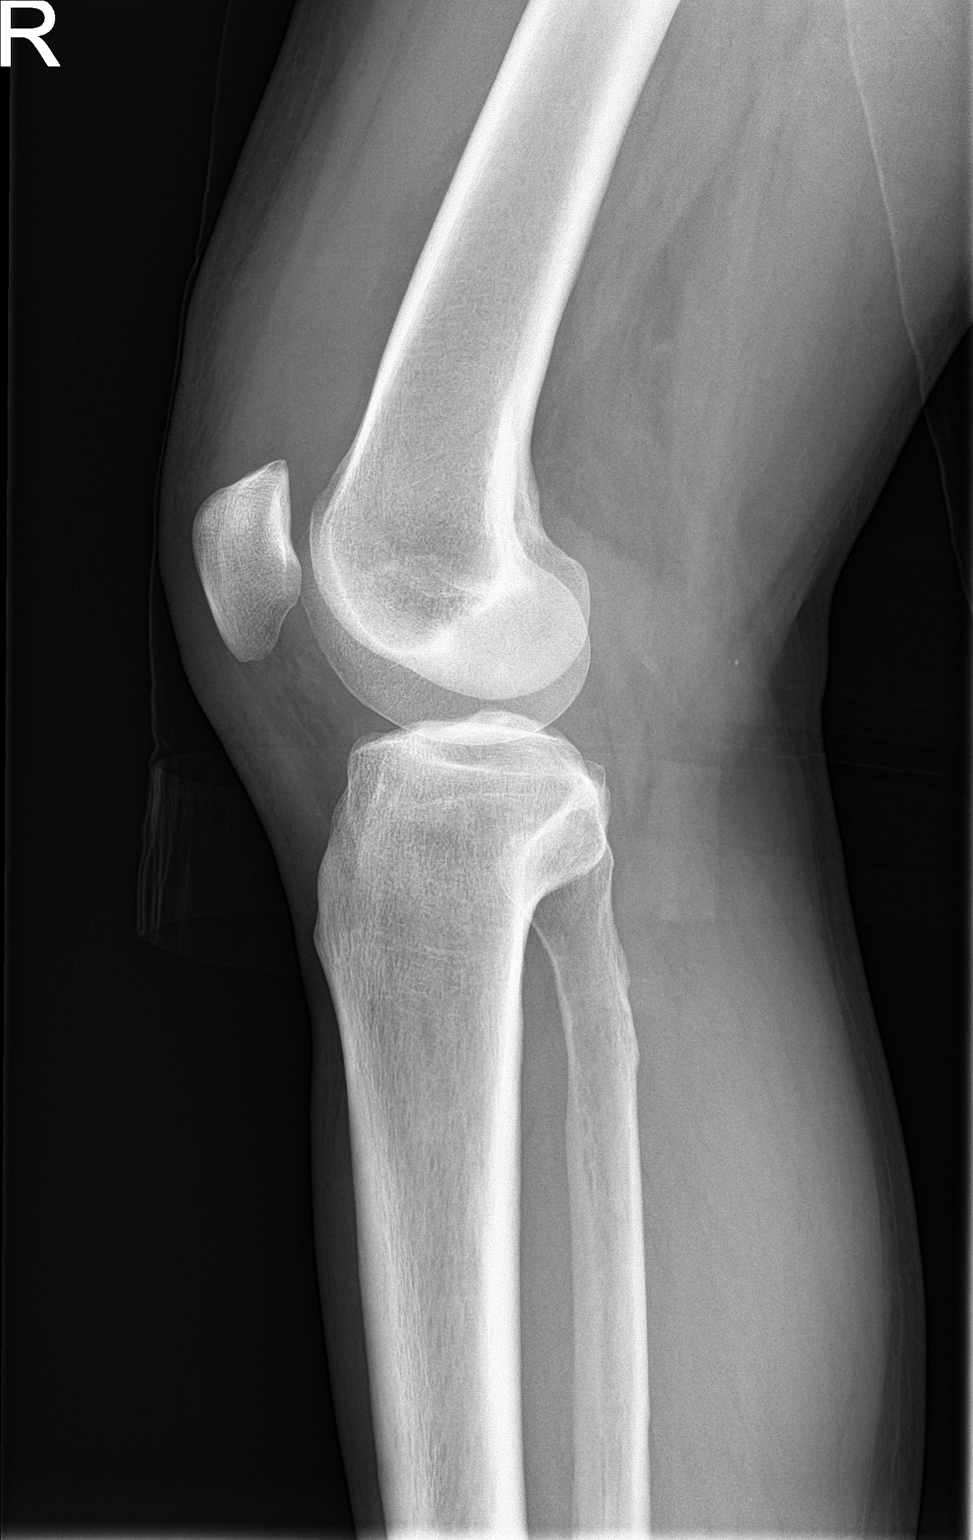

[2 of 2 positions shown; findings below may reference images not displayed]

FINDINGS: Standing views of both knees were obtained. The medial and lateral
compartments appear well preserved bilaterally. The patellofemoral
compartment on the right is unremarkable, but there does appear to
be a small right knee joint effusion present. No fracture is noted.
IMPRESSION: 1. Small to moderate size right knee joint effusion.
2. No acute fracture.  Joint spaces are well preserved.

## 2022-04-28 ENCOUNTER — Encounter: Payer: Managed Care, Other (non HMO) | Admitting: Family Medicine

## 2022-04-28 ENCOUNTER — Ambulatory Visit: Payer: 59 | Admitting: Family Medicine

## 2022-05-05 ENCOUNTER — Encounter: Payer: Self-pay | Admitting: Family Medicine

## 2022-05-05 ENCOUNTER — Ambulatory Visit (INDEPENDENT_AMBULATORY_CARE_PROVIDER_SITE_OTHER): Payer: 59 | Admitting: Family Medicine

## 2022-05-05 VITALS — BP 182/95 | HR 94 | Temp 98.3°F | Ht 64.0 in | Wt 172.0 lb

## 2022-05-05 DIAGNOSIS — H60533 Acute contact otitis externa, bilateral: Secondary | ICD-10-CM

## 2022-05-05 DIAGNOSIS — Z0001 Encounter for general adult medical examination with abnormal findings: Secondary | ICD-10-CM | POA: Diagnosis not present

## 2022-05-05 DIAGNOSIS — I10 Essential (primary) hypertension: Secondary | ICD-10-CM

## 2022-05-05 DIAGNOSIS — Z136 Encounter for screening for cardiovascular disorders: Secondary | ICD-10-CM

## 2022-05-05 DIAGNOSIS — E875 Hyperkalemia: Secondary | ICD-10-CM

## 2022-05-05 DIAGNOSIS — Z1211 Encounter for screening for malignant neoplasm of colon: Secondary | ICD-10-CM

## 2022-05-05 DIAGNOSIS — Z0000 Encounter for general adult medical examination without abnormal findings: Secondary | ICD-10-CM

## 2022-05-05 LAB — CMP14+EGFR

## 2022-05-05 LAB — LIPID PANEL

## 2022-05-05 MED ORDER — CIPROFLOXACIN-DEXAMETHASONE 0.3-0.1 % OT SUSP: 4.0000 [drp] | Freq: Two times a day (BID) | OTIC | 0 refills | Status: DC

## 2022-05-05 MED ORDER — LISINOPRIL 10 MG PO TABS: 10.0000 mg | ORAL_TABLET | Freq: Every day | ORAL | 0 refills | Status: DC

## 2022-05-05 NOTE — Progress Notes (Addendum)
New Patient Office Visit  Subjective   Patient ID: Terry Ryan, male    DOB: Apr 30, 1960  Age: 62 y.o. MRN: [id number]  CC:  Chief Complaint  Patient presents with   New Patient (Initial Visit)    HPI Terry Ryan presents to establish care  Pt previously seen by [facility] by Monia Pouch.  Mainly here today to restart BP medications. Has a hx of HTN and kidney stones. States that he does not have kidney dx. States he has access to a BP cuff at home, but has been using the one at [employer].  [facility] came to his work last week and his BP was LA was 178/112, Right Arm 149/103 and they recommended he resume care with PCP. States he has never had a stroke. Pt lost insurance access, so has not been seen for some years.  Endorses indigestion, headaches and dizziness. Denies changes in vision, palpitations, orthopnea, pnd, shortness of breath and sweats. In the past took Chlorthalidone and lisinopril, tolerated well.  Elevated glucose in the past.  Quit tobacco 20 years ago.  Walks at works, does not complete formal exercise. States he gets ~20,000 steps a day  Only drinks decaff drinks, eats vegetables, fruits, carbs, meats. Previously tried a keto diet and is considering restarting it with his wife.   Does not see dermatology  Works at [employer] not seen opth in while, usually sees the one at [place]  Denies substance use.  Has never had a colonoscopy Needs TDAP, shingrix    Outpatient Encounter Medications as of 05/05/2022  Medication Sig   [DISCONTINUED] chlorthalidone (HYGROTON) 25 MG tablet Take 0.5 tablets (12.5 mg total) by mouth daily.   [DISCONTINUED] ibuprofen (ADVIL) 800 MG tablet TAKE 1 TABLET BY MOUTH THREE TIMES DAILY WITH FOOD AS NEEDED FOR 15 DAYS   [DISCONTINUED] lisinopril (ZESTRIL) 20 MG tablet Take 1 tablet (20 mg total) by mouth daily.   [DISCONTINUED] naproxen (NAPROSYN) 500 MG tablet Take 1 tablet by mouth 2 (two) times daily with a  meal.   No facility-administered encounter medications on file as of 05/05/2022.    Past Medical History:  Diagnosis Date   Hypertension    Renal disorder    kidney stones    Past Surgical History:  Procedure Laterality Date   cyst removed from left arm and wrist     KNEE ARTHROSCOPY Right 02/25/2019    Family History  Problem Relation Age of Onset   Migraines Mother    Alzheimer's disease Father     Social History   Socioeconomic History   Marital status: Married    Spouse name: Not on file   Number of children: Not on file   Years of education: Not on file   Highest education level: Not on file  Occupational History   Not on file  Tobacco Use   Smoking status: Former    Packs/day: 1.00    Years: 20.00    Total pack years: 20.00    Types: Cigarettes    Quit date: 03/07/2002    Years since quitting: 20.1   Smokeless tobacco: Never  Vaping Use   Vaping Use: Never used  Substance and Sexual Activity   Alcohol use: Yes    Comment: occasionally   Drug use: No   Sexual activity: Not on file  Other Topics Concern   Not on file  Social History Narrative   Not on file   Social Determinants of Health  Financial Resource Strain: Not on file  Food Insecurity: Not on file  Transportation Needs: Not on file  Physical Activity: Not on file  Stress: Not on file  Social Connections: Not on file  Intimate Partner Violence: Not on file    Review of Systems  All other systems reviewed and are negative.     Objective   BP (!) 182/95   Pulse 94   Temp 98.3 F (36.8 C)   Ht '5\' 4"'$  (1.626 m)   Wt 172 lb (78 kg)   SpO2 97%   BMI 29.52 kg/m   Physical Exam Constitutional:      General: He is not in acute distress.    Appearance: Normal appearance. He is not ill-appearing, toxic-appearing or diaphoretic.  HENT:     Right Ear: External ear normal. Laceration present. Tympanic membrane is injected.     Left Ear: External ear normal. Laceration present.  Tympanic membrane is injected.     Ears:     Comments: Bilateral excoriation and bleeding in inner ear canals     Nose: Nose normal.     Mouth/Throat:     Mouth: Mucous membranes are moist.  Eyes:     Extraocular Movements: Extraocular movements intact.     Conjunctiva/sclera: Conjunctivae normal.     Pupils: Pupils are equal, round, and reactive to light.  Neck:     Thyroid: No thyroid mass, thyromegaly or thyroid tenderness.  Cardiovascular:     Rate and Rhythm: Normal rate.     Pulses: Normal pulses.     Heart sounds: Normal heart sounds. No murmur heard.    No friction rub. No gallop.  Pulmonary:     Effort: Pulmonary effort is normal. No respiratory distress.     Breath sounds: Normal breath sounds. No stridor. No wheezing, rhonchi or rales.  Chest:     Chest wall: No tenderness.  Abdominal:     General: Abdomen is flat. Bowel sounds are normal. There is no distension.     Palpations: Abdomen is soft. There is no mass.     Tenderness: There is no abdominal tenderness. There is no guarding or rebound.     Hernia: No hernia is present.  Musculoskeletal:        General: Normal range of motion.  Skin:    General: Skin is warm.     Capillary Refill: Capillary refill takes less than 2 seconds.  Neurological:     General: No focal deficit present.     Mental Status: He is alert and oriented to person, place, and time. Mental status is at baseline.     Cranial Nerves: No cranial nerve deficit.     Motor: No weakness.     Coordination: Coordination normal.  Psychiatric:        Mood and Affect: Mood normal.        Behavior: Behavior normal.        Thought Content: Thought content normal.        Judgment: Judgment normal.    Assessment & Plan:  1. Encounter for well adult exam without abnormal findings Labs as below. Will communicate results to patient once available.  Discussed getting the shingles vaccine. Pt states that he has had a hx of shingles. Is considering getting  the vaccine. Will reassess at follow up.  Discussed with patient to continue healthy lifestyle choices, including diet (rich in fruits, vegetables, and lean proteins, and low in salt and simple carbohydrates) and exercise (at least  30 minutes of moderate physical activity daily). Limit beverages high is sugar. Recommended at least 80-100 oz of water daily.   - CMP14+EGFR - CBC with Differential/Platelet - PSA  2. Encounter for screening for cardiovascular disorders Labs as below. Will communicate results to patient once available.  - Lipid panel (coffee w/ cream)   3. Primary hypertension Labs as below. Will communicate results to patient once available.  Will start medication as below. Previously on lisinopril 20 and Chlorthalidone 25 mg. Discussed with patient monitoring BP at home. Provided BP log to patient in clinic today. Instructed pt to take BP first thing in the morning after sitting for 5 minutes with feet flat on the floor, arm at heart level. Discussed with patient options for BP cuff at [facility], [facility], [facility]. Pt verbalized they were able to obtain BP cuff. Will review measurements with patient at follow up and determine plan for BP management. Instructed pt to bring cuff in for comparison with cuff in office. Provided handout on DASH diet.  - CMP14+EGFR - CBC with Differential/Platelet - TSH - lisinopril (ZESTRIL) 10 MG tablet; Take 1 tablet (10 mg total) by mouth daily.  Dispense: 30 tablet; Refill: 0  4. Screening for colon cancer Referral placed as below for colonoscopy.  - Ambulatory referral to Gastroenterology  5. Acute contact otitis externa of both ears Bilateral ear canals with excoriation and bleeding. Medication as below. Instructed pt to utilize OTC debrox for wax concerns. Instructed pt to stop using q-tips.  - ciprofloxacin-dexamethasone (CIPRODEX) OTIC suspension; Place 4 drops into both ears 2 (two) times daily. Complete for 14 days  Dispense: 7.5 mL;  Refill: 0   The above assessment and management plan was discussed with the patient. The patient verbalized understanding of and has agreed to the management plan using shared-decision making. Patient is aware to call the clinic if they develop any new symptoms or if symptoms fail to improve or worsen. Patient is aware when to return to the clinic for a follow-up visit. Patient educated on when it is appropriate to go to the emergency department.   Return in about 4 weeks (around 06/02/2022) for Chronic Condition Follow up.   Donzetta Kohut, DNP-FNP [facility] [address] [place], [place] [address] [phone]

## 2022-05-06 ENCOUNTER — Other Ambulatory Visit: Payer: Self-pay | Admitting: *Deleted

## 2022-05-06 DIAGNOSIS — R899 Unspecified abnormal finding in specimens from other organs, systems and tissues: Secondary | ICD-10-CM

## 2022-05-06 LAB — CBC WITH DIFFERENTIAL/PLATELET
Basophils Absolute: 0 10*3/uL (ref 0.0–0.2)
Basos: 0 %
EOS (ABSOLUTE): 0.1 10*3/uL (ref 0.0–0.4)
Eos: 1 %
Hematocrit: 48.2 % (ref 37.5–51.0)
Hemoglobin: 16.1 g/dL (ref 13.0–17.7)
Immature Grans (Abs): 0 10*3/uL (ref 0.0–0.1)
Immature Granulocytes: 0 %
Lymphocytes Absolute: 3.1 10*3/uL (ref 0.7–3.1)
Lymphs: 32 %
MCH: 29.3 pg (ref 26.6–33.0)
MCHC: 33.4 g/dL (ref 31.5–35.7)
MCV: 88 fL (ref 79–97)
Monocytes Absolute: 0.8 10*3/uL (ref 0.1–0.9)
Monocytes: 8 %
Neutrophils Absolute: 5.7 10*3/uL (ref 1.4–7.0)
Neutrophils: 59 %
Platelets: 372 10*3/uL (ref 150–450)
RBC: 5.49 x10E6/uL (ref 4.14–5.80)
RDW: 12.2 % (ref 11.6–15.4)
WBC: 9.7 10*3/uL (ref 3.4–10.8)

## 2022-05-06 LAB — LIPID PANEL
Chol/HDL Ratio: 5.4 ratio — ABNORMAL HIGH (ref 0.0–5.0)
Cholesterol, Total: 158 mg/dL (ref 100–199)
HDL: 29 mg/dL — ABNORMAL LOW (ref >39)
LDL Chol Calc (NIH): 112 mg/dL — ABNORMAL HIGH (ref 0–99)
Triglycerides: 92 mg/dL (ref 0–149)
VLDL Cholesterol Cal: 17 mg/dL (ref 5–40)

## 2022-05-06 LAB — CMP14+EGFR
Albumin/Globulin Ratio: 1.7 (ref 1.2–2.2)
Albumin: 4.8 g/dL (ref 3.8–4.9)
Alkaline Phosphatase: 89 IU/L (ref 44–121)
BUN/Creatinine Ratio: 12 (ref 10–24)
BUN: 13 mg/dL (ref 8–27)
Bilirubin Total: 0.4 mg/dL (ref 0.0–1.2)
Calcium: 11 mg/dL — ABNORMAL HIGH (ref 8.6–10.2)
Creatinine, Ser: 1.05 mg/dL (ref 0.76–1.27)
Potassium: 5.8 mmol/L — ABNORMAL HIGH (ref 3.5–5.2)
Sodium: 142 mmol/L (ref 134–144)
Total Protein: 7.7 g/dL (ref 6.0–8.5)

## 2022-05-06 LAB — PSA: Prostate Specific Ag, Serum: 0.6 ng/mL (ref 0.0–4.0)

## 2022-05-06 LAB — TSH: TSH: 1.04 u[IU]/mL (ref 0.450–4.500)

## 2022-05-06 NOTE — Addendum Note (Signed)
Addended by: Donzetta Kohut on: 05/06/2022 10:07 AM   Modules accepted: Orders

## 2022-05-09 LAB — SPECIMEN STATUS REPORT

## 2022-05-09 LAB — HGB A1C W/O EAG: Hgb A1c MFr Bld: 6.4 % — ABNORMAL HIGH (ref 4.8–5.6)

## 2022-05-10 ENCOUNTER — Encounter: Payer: Self-pay | Admitting: *Deleted

## 2022-05-11 ENCOUNTER — Other Ambulatory Visit: Payer: 59

## 2022-05-11 DIAGNOSIS — E875 Hyperkalemia: Secondary | ICD-10-CM

## 2022-05-12 LAB — CMP14+EGFR
ALT: 34 IU/L (ref 0–44)
AST: 28 IU/L (ref 0–40)
Albumin/Globulin Ratio: 1.7 (ref 1.2–2.2)
Albumin: 4.6 g/dL (ref 3.8–4.9)
Alkaline Phosphatase: 76 IU/L (ref 44–121)
BUN/Creatinine Ratio: 19 (ref 10–24)
BUN: 20 mg/dL (ref 8–27)
Bilirubin Total: 0.3 mg/dL (ref 0.0–1.2)
CO2: 22 mmol/L (ref 20–29)
Calcium: 9.8 mg/dL (ref 8.6–10.2)
Chloride: 99 mmol/L (ref 96–106)
Creatinine, Ser: 1.04 mg/dL (ref 0.76–1.27)
Globulin, Total: 2.7 g/dL (ref 1.5–4.5)
Glucose: 74 mg/dL (ref 70–99)
Potassium: 5.1 mmol/L (ref 3.5–5.2)
Sodium: 139 mmol/L (ref 134–144)
Total Protein: 7.3 g/dL (ref 6.0–8.5)
eGFR: 82 mL/min/{1.73_m2} (ref >59)

## 2022-05-19 ENCOUNTER — Encounter: Payer: Self-pay | Admitting: Family Medicine

## 2022-05-19 ENCOUNTER — Ambulatory Visit (INDEPENDENT_AMBULATORY_CARE_PROVIDER_SITE_OTHER): Payer: 59 | Admitting: Family Medicine

## 2022-05-19 ENCOUNTER — Other Ambulatory Visit: Payer: Self-pay | Admitting: *Deleted

## 2022-05-19 VITALS — BP 153/76 | HR 95 | Temp 98.9°F | Ht 64.0 in | Wt 173.0 lb

## 2022-05-19 DIAGNOSIS — R202 Paresthesia of skin: Secondary | ICD-10-CM | POA: Diagnosis not present

## 2022-05-19 DIAGNOSIS — I10 Essential (primary) hypertension: Secondary | ICD-10-CM

## 2022-05-19 DIAGNOSIS — H43391 Other vitreous opacities, right eye: Secondary | ICD-10-CM

## 2022-05-19 DIAGNOSIS — Z23 Encounter for immunization: Secondary | ICD-10-CM

## 2022-05-19 DIAGNOSIS — H60533 Acute contact otitis externa, bilateral: Secondary | ICD-10-CM | POA: Diagnosis not present

## 2022-05-19 MED ORDER — LISINOPRIL 20 MG PO TABS: 20.0000 mg | ORAL_TABLET | Freq: Every day | ORAL | 3 refills | Status: DC

## 2022-05-19 MED ORDER — PREDNISONE 20 MG PO TABS: 40.0000 mg | ORAL_TABLET | Freq: Every day | ORAL | 0 refills | Status: AC

## 2022-05-19 NOTE — Addendum Note (Signed)
Addended by: Geryl Rankins D on: 05/19/2022 05:32 PM   Modules accepted: Orders

## 2022-05-19 NOTE — Progress Notes (Addendum)
Acute Office Visit  Subjective:  Patient ID: Terry Ryan, male    DOB: [birth year], 62 y.o.   MRN: [id number]  Chief Complaint  Patient presents with   2 week f/u blood pressure check    Patient is in today for follow up on BP  Pt received a home BP monitor from his work. BP at home reading on Left Arm Sitting in AM after waking is 178/86, 188/112, 169/115, 182/107, 159/107, 167/103.  Was using a wrist one at first. Right eye blurred vision with a "spot" that can go away. Denies palpitations, shortness of breath, sweats, edema. States that he and wife are changing diet and he knows he needs to exercise. Take ibuprofen if he has a headache. Does not take it daily.   Ear irritation  Has been using debrox and ear removal kit. No longer using q tips. Did not pick up antibiotic due to price.   Paraesthesias  In right hand first 3 fingers. States that he is sitting at a desk more not   Review of Systems  Eyes:  Positive for blurred vision.  All other systems reviewed and are negative.  As per HPI.   Objective:  BP (!) 153/76   Pulse 95   Temp 98.9 F (37.2 C)   Ht '5\' 4"'$  (1.626 m)   Wt 173 lb (78.5 kg)   SpO2 96%   BMI 29.70 kg/m   Physical Exam Constitutional:      General: He is not in acute distress.    Appearance: Normal appearance. He is not ill-appearing, toxic-appearing or diaphoretic.  HENT:     Right Ear: Laceration present.     Left Ear: Laceration present.     Ears:     Comments: Healing scabs of bilateral canals Cardiovascular:     Rate and Rhythm: Normal rate.     Pulses: Normal pulses.     Heart sounds: Normal heart sounds. No murmur heard.    No gallop.  Pulmonary:     Effort: Pulmonary effort is normal. No respiratory distress.     Breath sounds: Normal breath sounds. No stridor. No wheezing, rhonchi or rales.  Skin:    General: Skin is warm.     Capillary Refill: Capillary refill takes less than 2 seconds.  Neurological:     General: No focal  deficit present.     Mental Status: He is alert and oriented to person, place, and time. Mental status is at baseline.     Motor: No weakness.  Psychiatric:        Mood and Affect: Mood normal.        Behavior: Behavior normal.        Thought Content: Thought content normal.        Judgment: Judgment normal.    Assessment & Plan:  1. Primary hypertension Will increased medication as below. Continue to monitor BP at home. Will follow up in 4 weeks until at goal.  - lisinopril (ZESTRIL) 20 MG tablet; Take 1 tablet (20 mg total) by mouth daily.  Dispense: 90 tablet; Refill: 3  2. Paresthesias Low concern for carpal tunnel as patient did not have reproduction of symptoms with Phalen or Tinnel test. 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 Will start with conservative treatment as below. Will continue to monitor if patient does not improve.  - predniSONE (DELTASONE) 20 MG tablet; Take 2 tablets (40 mg total) by mouth daily  with breakfast for 5 days.  Dispense: 10 tablet; Refill: 0  3. Floater, vitreous, right Strongly encouraged pt to see his eye doctor in the next week. Discussed with patient the risk of Retinal Detachment and that this was an emergency to seek emergency care if floater remains. Instructed pt to call back to office if he was not able to get in with ophthalmology so that I could place stat referral. Pt verbalized understanding.  4. Acute contact otitis externa of both ears Improving, Will continue to monitor. Pt did not pick up previously prescribed medication due to cost.   The above assessment and management plan was discussed with the patient. The patient verbalized understanding of and has agreed to the management plan using shared-decision making. Patient is aware to call the clinic if they develop any new symptoms or if symptoms fail to improve or worsen. Patient is aware when to return to the clinic for a follow-up  visit. Patient educated on when it is appropriate to go to the emergency department.   Donzetta Kohut, DNP-FNP [facility] [address] [place], [place] [address] [phone]

## 2022-06-16 ENCOUNTER — Ambulatory Visit (INDEPENDENT_AMBULATORY_CARE_PROVIDER_SITE_OTHER): Payer: 59 | Admitting: Family Medicine

## 2022-06-16 ENCOUNTER — Encounter: Payer: Self-pay | Admitting: Family Medicine

## 2022-06-16 VITALS — BP 136/78 | HR 77 | Temp 98.9°F | Ht 64.0 in | Wt 177.0 lb

## 2022-06-16 DIAGNOSIS — E785 Hyperlipidemia, unspecified: Secondary | ICD-10-CM | POA: Insufficient documentation

## 2022-06-16 DIAGNOSIS — H938X3 Other specified disorders of ear, bilateral: Secondary | ICD-10-CM | POA: Diagnosis not present

## 2022-06-16 DIAGNOSIS — R7303 Prediabetes: Secondary | ICD-10-CM | POA: Insufficient documentation

## 2022-06-16 DIAGNOSIS — H43391 Other vitreous opacities, right eye: Secondary | ICD-10-CM | POA: Diagnosis not present

## 2022-06-16 DIAGNOSIS — I10 Essential (primary) hypertension: Secondary | ICD-10-CM | POA: Diagnosis not present

## 2022-06-16 MED ORDER — LISINOPRIL 10 MG PO TABS: 10.0000 mg | ORAL_TABLET | Freq: Every day | ORAL | 0 refills | Status: DC

## 2022-06-16 NOTE — Progress Notes (Signed)
Acute Office Visit  Subjective:  Patient ID: Terry Ryan, male    DOB: 10-Jan-1961, 62 y.o.   MRN: [id number]  Chief Complaint  Patient presents with   Medical Management of Chronic Issues    F/u hypertension    HPI Patient is in today for follow up of his BP  States that it is higher in the mornings than the evenings.  Yesterday was 170/101  150-170 in the mornings 138-140s in the evening.  Denies changes to his vision. States that the floaters have improved. Did not go to the eye MD. Denies dizziness, lightheadedness, SOB, edema.   Ears  Has to wear ear plugs at work and finds himself scratching his ears frequently. Symptoms are improving  Switches out ear plugs daily.   ROS As per HPI Objective:  BP 136/78   Pulse 77   Temp 98.9 F (37.2 C)   Ht 5\' 4"  (1.626 m)   Wt 177 lb (80.3 kg)   SpO2 96%   BMI 30.38 kg/m    Physical Exam Constitutional:      General: He is not in acute distress.    Appearance: Normal appearance. He is well-developed and well-groomed. He is not ill-appearing, toxic-appearing or diaphoretic.  HENT:     Right Ear: Laceration present.     Left Ear: Laceration present. Tympanic membrane is injected.     Ears:     Comments: Healing scabs in bilateral ear canals  Cardiovascular:     Rate and Rhythm: Normal rate.     Pulses: Normal pulses.     Heart sounds: Normal heart sounds. No murmur heard.    No gallop.  Pulmonary:     Effort: Pulmonary effort is normal. No respiratory distress.     Breath sounds: Normal breath sounds. No stridor, decreased air movement or transmitted upper airway sounds. No decreased breath sounds, wheezing, rhonchi or rales.  Musculoskeletal:     Right lower leg: No edema.     Left lower leg: No edema.  Skin:    General: Skin is warm.     Capillary Refill: Capillary refill takes less than 2 seconds.  Neurological:     General: No focal deficit present.     Mental Status: He is alert and oriented to person,  place, and time. Mental status is at baseline.     Motor: No weakness.  Psychiatric:        Mood and Affect: Mood normal.        Behavior: Behavior normal. Behavior is cooperative.        Thought Content: Thought content normal.        Judgment: Judgment normal.    Assessment & Plan:  1. Primary hypertension Patient not at goal. Home monitor checked with clinic monitor and is not very accurate. Patient verbalized that he was able to obtain an different monitor. Provided patient with recommendations of where to purchase monitor. Patient declined DME order for BP cuff today. Will slowly titrate medication as below. Discussed with patient signs of hypotension to monitor for. - lisinopril (ZESTRIL) 10 MG tablet; Take 1 tablet (10 mg total) by mouth daily.  Dispense: 60 tablet; Refill: 0 - CMP14+EGFR; Future  2. Floater, vitreous, right Patient has not had follow up with ophthalmology. States that his symptoms have improved greatly. Encouraged patient to follow with ophthalmology. Verbalized that he would as soon as he finds his insurance card.   3. Prediabetes Provided patient education on prediabetes. Patient previously notified  of diagnosis. Will follow up with A1C at the end of May.  - Bayer DCA Hb A1c Waived; Future  4. Irritation of ear, bilateral Discussed with patient proper ear hygiene. He is exchanging his ear plugs at work and is trying to decrease scratching his ears. Will continue to monitor at follow up.   5. Dyslipidemia Discussed with patient to continue healthy lifestyle choices, including diet (rich in fruits, vegetables, and lean proteins, and low in salt and simple carbohydrates) and exercise (at least 30 minutes of moderate physical activity daily). Limit beverages high is sugar. Recommended at least 80-100 oz of water daily. Will recheck lipid panel at follow up.  - Lipid panel; Future  The above assessment and management plan was discussed with the patient. The patient  verbalized understanding of and has agreed to the management plan using shared-decision making. Patient is aware to call the clinic if they develop any new symptoms or if symptoms fail to improve or worsen. Patient is aware when to return to the clinic for a follow-up visit. Patient educated on when it is appropriate to go to the emergency department.   Neale Burly, DNP-FNP [facility] [address] [place], [place] [address] [phone]

## 2022-06-16 NOTE — Patient Instructions (Signed)
[facility]

## 2022-08-03 ENCOUNTER — Ambulatory Visit (INDEPENDENT_AMBULATORY_CARE_PROVIDER_SITE_OTHER): Payer: 59 | Admitting: Family Medicine

## 2022-08-03 ENCOUNTER — Encounter: Payer: Self-pay | Admitting: Family Medicine

## 2022-08-03 VITALS — BP 155/86 | HR 71 | Temp 97.2°F | Ht 64.0 in | Wt 172.6 lb

## 2022-08-03 DIAGNOSIS — Z23 Encounter for immunization: Secondary | ICD-10-CM | POA: Diagnosis not present

## 2022-08-03 DIAGNOSIS — I10 Essential (primary) hypertension: Secondary | ICD-10-CM | POA: Diagnosis not present

## 2022-08-03 DIAGNOSIS — E785 Hyperlipidemia, unspecified: Secondary | ICD-10-CM

## 2022-08-03 DIAGNOSIS — R7303 Prediabetes: Secondary | ICD-10-CM | POA: Diagnosis not present

## 2022-08-03 DIAGNOSIS — Z1211 Encounter for screening for malignant neoplasm of colon: Secondary | ICD-10-CM

## 2022-08-03 DIAGNOSIS — E875 Hyperkalemia: Secondary | ICD-10-CM

## 2022-08-03 DIAGNOSIS — F419 Anxiety disorder, unspecified: Secondary | ICD-10-CM

## 2022-08-03 LAB — CMP14+EGFR
ALT: 30 IU/L (ref 0–44)
AST: 22 IU/L (ref 0–40)
Albumin/Globulin Ratio: 2 (ref 1.2–2.2)
Albumin: 4.8 g/dL (ref 3.8–4.9)
Alkaline Phosphatase: 73 IU/L (ref 44–121)
BUN/Creatinine Ratio: 21 (ref 10–24)
BUN: 24 mg/dL (ref 8–27)
Bilirubin Total: 0.5 mg/dL (ref 0.0–1.2)
CO2: 23 mmol/L (ref 20–29)
Calcium: 10.1 mg/dL (ref 8.6–10.2)
Chloride: 106 mmol/L (ref 96–106)
Creatinine, Ser: 1.15 mg/dL (ref 0.76–1.27)
Globulin, Total: 2.4 g/dL (ref 1.5–4.5)
Glucose: 101 mg/dL — ABNORMAL HIGH (ref 70–99)
Potassium: 5.4 mmol/L — ABNORMAL HIGH (ref 3.5–5.2)
Sodium: 143 mmol/L (ref 134–144)
Total Protein: 7.2 g/dL (ref 6.0–8.5)
eGFR: 73 mL/min/{1.73_m2} (ref >59)

## 2022-08-03 LAB — LIPID PANEL
Chol/HDL Ratio: 4.8 ratio (ref 0.0–5.0)
Cholesterol, Total: 163 mg/dL (ref 100–199)
HDL: 34 mg/dL — ABNORMAL LOW (ref >39)
LDL Chol Calc (NIH): 112 mg/dL — ABNORMAL HIGH (ref 0–99)
Triglycerides: 91 mg/dL (ref 0–149)
VLDL Cholesterol Cal: 17 mg/dL (ref 5–40)

## 2022-08-03 LAB — BAYER DCA HB A1C WAIVED: HB A1C (BAYER DCA - WAIVED): 5.9 % — ABNORMAL HIGH (ref 4.8–5.6)

## 2022-08-03 MED ORDER — LISINOPRIL 40 MG PO TABS
40.0000 mg | ORAL_TABLET | Freq: Every day | ORAL | 0 refills | Status: DC
Start: 2022-08-03 — End: 2022-11-04

## 2022-08-03 MED ORDER — SERTRALINE HCL 50 MG PO TABS
50.0000 mg | ORAL_TABLET | Freq: Every day | ORAL | 0 refills | Status: DC
Start: 2022-08-03 — End: 2022-09-27
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
## 2022-08-05 MED ORDER — ROSUVASTATIN CALCIUM 10 MG PO TABS: 10.0000 mg | ORAL_TABLET | Freq: Every day | ORAL | 0 refills | Status: DC

## 2022-08-05 NOTE — Addendum Note (Signed)
Addended by: Neale Burly on: 08/05/2022 04:52 PM   Modules accepted: Orders

## 2022-08-05 NOTE — Addendum Note (Signed)
Addended by: Neale Burly on: 08/05/2022 01:12 PM   Modules accepted: Orders

## 2022-08-05 NOTE — Progress Notes (Signed)
Is patient having palpitations, muscle aches, fatigue, which could be related to hyperkalemia? Would like for him to come in Monday for lab appt to recheck K+. May be due to lisinopril and if so we can change medication. Lab order placed.  Cholesterol is elevated. Diet encouraged - increase intake of fresh fruits and vegetables, increase intake of lean proteins. Bake, broil, or grill foods. Avoid fried, greasy, and fatty foods. Avoid fast foods. Increase intake of fiber-rich whole grains. Exercise encouraged - at least 150 minutes per week and advance as tolerated. Can also try red yeast rice, but ASCVD risk is high. Would like to start him on crestor if willing.  A1C remains in prediabetes range. Continue with diet and lifestyle changes.   The 10-year ASCVD risk score (Arnett DK, et al., 2019) is: 15.1%   Values used to calculate the score:     Age: 62 years     Sex: Male     Is Non-Hispanic African American: No     Diabetic: No     Tobacco smoker: No     Systolic Blood Pressure: 155 mmHg     Is BP treated: Yes     HDL Cholesterol: 34 mg/dL     Total Cholesterol: 163 mg/dL

## 2022-08-05 NOTE — Progress Notes (Signed)
Crestor sent to [facility] in [place].

## 2022-08-09 ENCOUNTER — Other Ambulatory Visit: Payer: 59

## 2022-08-09 DIAGNOSIS — E875 Hyperkalemia: Secondary | ICD-10-CM

## 2022-08-10 LAB — BMP8+EGFR
BUN/Creatinine Ratio: 12 (ref 10–24)
BUN: 13 mg/dL (ref 8–27)
CO2: 28 mmol/L (ref 20–29)
Calcium: 9.9 mg/dL (ref 8.6–10.2)
Chloride: 101 mmol/L (ref 96–106)
Creatinine, Ser: 1.05 mg/dL (ref 0.76–1.27)
Glucose: 81 mg/dL (ref 70–99)
Potassium: 4.8 mmol/L (ref 3.5–5.2)
Sodium: 141 mmol/L (ref 134–144)
eGFR: 81 mL/min/{1.73_m2} (ref >59)

## 2022-08-10 NOTE — Progress Notes (Signed)
Potassium resolved. Can continue with current dose of Lisinopril.

## 2022-08-23 ENCOUNTER — Ambulatory Visit: Payer: 59

## 2022-08-29 LAB — COLOGUARD: COLOGUARD: NEGATIVE

## 2022-09-16 ENCOUNTER — Encounter: Payer: Self-pay | Admitting: *Deleted

## 2022-09-27 ENCOUNTER — Ambulatory Visit (INDEPENDENT_AMBULATORY_CARE_PROVIDER_SITE_OTHER): Payer: 59 | Admitting: Family Medicine

## 2022-09-27 ENCOUNTER — Encounter: Payer: Self-pay | Admitting: Family Medicine

## 2022-09-27 VITALS — BP 150/87 | HR 79 | Temp 98.7°F | Ht 64.0 in | Wt 182.0 lb

## 2022-09-27 DIAGNOSIS — H43391 Other vitreous opacities, right eye: Secondary | ICD-10-CM

## 2022-09-27 DIAGNOSIS — F419 Anxiety disorder, unspecified: Secondary | ICD-10-CM | POA: Insufficient documentation

## 2022-09-27 DIAGNOSIS — I10 Essential (primary) hypertension: Secondary | ICD-10-CM

## 2022-09-27 DIAGNOSIS — K219 Gastro-esophageal reflux disease without esophagitis: Secondary | ICD-10-CM | POA: Insufficient documentation

## 2022-09-27 DIAGNOSIS — F320 Major depressive disorder, single episode, mild: Secondary | ICD-10-CM

## 2022-09-27 DIAGNOSIS — E875 Hyperkalemia: Secondary | ICD-10-CM

## 2022-09-27 DIAGNOSIS — F1010 Alcohol abuse, uncomplicated: Secondary | ICD-10-CM | POA: Insufficient documentation

## 2022-09-27 DIAGNOSIS — M5440 Lumbago with sciatica, unspecified side: Secondary | ICD-10-CM

## 2022-09-27 MED ORDER — SERTRALINE HCL 100 MG PO TABS
100.0000 mg | ORAL_TABLET | Freq: Every day | ORAL | 5 refills | Status: DC
Start: 2022-09-27 — End: 2023-03-19

## 2022-09-27 MED ORDER — HYDROCHLOROTHIAZIDE 25 MG PO TABS
25.0000 mg | ORAL_TABLET | Freq: Every day | ORAL | 3 refills | Status: DC
Start: 2022-09-27 — End: 2023-09-11
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
## 2022-09-28 LAB — CMP14+EGFR
ALT: 32 IU/L (ref 0–44)
AST: 24 IU/L (ref 0–40)
Albumin: 4.6 g/dL (ref 3.8–4.9)
Alkaline Phosphatase: 79 IU/L (ref 44–121)
BUN/Creatinine Ratio: 12 (ref 10–24)
BUN: 13 mg/dL (ref 8–27)
Bilirubin Total: 0.2 mg/dL (ref 0.0–1.2)
CO2: 28 mmol/L (ref 20–29)
Calcium: 9.7 mg/dL (ref 8.6–10.2)
Chloride: 101 mmol/L (ref 96–106)
Creatinine, Ser: 1.06 mg/dL (ref 0.76–1.27)
Globulin, Total: 2.3 g/dL (ref 1.5–4.5)
Glucose: 96 mg/dL (ref 70–99)
Potassium: 5 mmol/L (ref 3.5–5.2)
Sodium: 140 mmol/L (ref 134–144)
Total Protein: 6.9 g/dL (ref 6.0–8.5)
eGFR: 80 mL/min/{1.73_m2} (ref >59)

## 2022-09-28 LAB — VITAMIN B12: Vitamin B-12: 243 pg/mL (ref 232–1245)

## 2022-09-28 NOTE — Progress Notes (Signed)
Labs are normal. Can continue current medications.

## 2022-09-29 ENCOUNTER — Ambulatory Visit: Payer: 59 | Admitting: Family Medicine

## 2022-10-11 ENCOUNTER — Encounter: Payer: Self-pay | Admitting: *Deleted

## 2022-11-04 ENCOUNTER — Other Ambulatory Visit: Payer: Self-pay | Admitting: Family Medicine

## 2022-11-04 DIAGNOSIS — I10 Essential (primary) hypertension: Secondary | ICD-10-CM

## 2022-11-04 MED ORDER — LISINOPRIL 40 MG PO TABS
40.0000 mg | ORAL_TABLET | Freq: Every day | ORAL | 1 refills | Status: DC
Start: 2022-11-04 — End: 2023-04-26

## 2022-11-11 ENCOUNTER — Other Ambulatory Visit: Payer: Self-pay | Admitting: Family Medicine

## 2022-11-11 MED ORDER — ROSUVASTATIN CALCIUM 10 MG PO TABS: 10.0000 mg | ORAL_TABLET | Freq: Every day | ORAL | 0 refills | Status: DC

## 2022-11-24 ENCOUNTER — Encounter: Payer: Self-pay | Admitting: Family Medicine

## 2022-11-24 ENCOUNTER — Ambulatory Visit (INDEPENDENT_AMBULATORY_CARE_PROVIDER_SITE_OTHER): Payer: 59 | Admitting: Family Medicine

## 2022-11-24 VITALS — BP 135/83 | HR 62 | Temp 98.5°F | Ht 64.0 in | Wt 179.0 lb

## 2022-11-24 DIAGNOSIS — I10 Essential (primary) hypertension: Secondary | ICD-10-CM

## 2022-11-24 DIAGNOSIS — F419 Anxiety disorder, unspecified: Secondary | ICD-10-CM | POA: Diagnosis not present

## 2022-11-24 DIAGNOSIS — F320 Major depressive disorder, single episode, mild: Secondary | ICD-10-CM

## 2022-11-24 DIAGNOSIS — R7303 Prediabetes: Secondary | ICD-10-CM | POA: Diagnosis not present

## 2022-11-24 DIAGNOSIS — E875 Hyperkalemia: Secondary | ICD-10-CM

## 2022-11-24 DIAGNOSIS — E785 Hyperlipidemia, unspecified: Secondary | ICD-10-CM

## 2022-11-24 LAB — BAYER DCA HB A1C WAIVED: HB A1C (BAYER DCA - WAIVED): 6 % — ABNORMAL HIGH (ref 4.8–5.6)

## 2022-11-24 LAB — LIPID PANEL

## 2022-11-24 NOTE — Progress Notes (Signed)
Subjective:  Patient ID: Terry Ryan, male    DOB: 10-01-1960, 62 y.o.   MRN: [id number]  Patient Care Team: Arrie Senate, FNP as PCP - General (Family Medicine)   Chief Complaint:  Medical Management of Chronic Issues   HPI: Terry Ryan is a 62 y.o. male presenting on 11/24/2022 for Medical Management of Chronic Issues  HPI Hypertension Has BP monitor at home Yes BP at home average 136/90 States that most mornings 120/70s ROS Denies peripheral edema, changes to vision, chest pain, headaches, palpitations, sweats, SOB, PND, orthopnea Endorses anxiety and fatigue. Reports that floaters that have improved  Meds hydrochlorothiazide, lisinopril  CAD risks hypertension, hypercholesterolemia/hyperlipidemia  Anxiety/Depression  States that zoloft is doing well  Denies any side effects. Denies SI Reports that his stepson has now moved out.   Prediabetes  Has not started checking BG at home.  Denies any diet changes.  Walking  10-20,000 steps daily   Relevant past medical, surgical, family, and social history reviewed and updated as indicated.  Allergies and medications reviewed and updated. Data reviewed: Chart in Epic.   Past Medical History:  Diagnosis Date   Hypertension    Renal disorder    kidney stones   Sprain of lateral collateral ligament of right knee 12/26/2017    Past Surgical History:  Procedure Laterality Date   cyst removed from left arm and wrist     KNEE ARTHROSCOPY Right 02/25/2019    Social History   Socioeconomic History   Marital status: Married    Spouse name: Not on file   Number of children: Not on file   Years of education: Not on file   Highest education level: 12th grade  Occupational History   Not on file  Tobacco Use   Smoking status: Former    Current packs/day: 0.00    Average packs/day: 1 pack/day for 20.0 years (20.0 ttl pk-yrs)    Types: Cigarettes    Start date: 03/07/1982    Quit date: 03/07/2002     Years since quitting: 20.7   Smokeless tobacco: Never  Vaping Use   Vaping status: Never Used  Substance and Sexual Activity   Alcohol use: Yes    Comment: occasionally   Drug use: No   Sexual activity: Not on file  Other Topics Concern   Not on file  Social History Narrative   Not on file   Social Determinants of Health   Financial Resource Strain: Patient Declined (06/15/2022)   Overall Financial Resource Strain (CARDIA)    Difficulty of Paying Living Expenses: Patient declined  Food Insecurity: Patient Declined (06/15/2022)   Hunger Vital Sign    Worried About Running Out of Food in the Last Year: Patient declined    Ran Out of Food in the Last Year: Patient declined  Transportation Needs: No Transportation Needs (06/15/2022)   PRAPARE - Administrator, Civil Service (Medical): No    Lack of Transportation (Non-Medical): No  Physical Activity: Unknown (06/15/2022)   Exercise Vital Sign    Days of Exercise per Week: 4 days    Minutes of Exercise per Session: Patient declined  Stress: Patient Declined (06/15/2022)   [employer] of Occupational Health - Occupational Stress Questionnaire    Feeling of Stress : Patient declined  Social Connections: Unknown (06/15/2022)   Social Connection and Isolation Panel [NHANES]    Frequency of Communication with Friends and Family: More than three times a week  Frequency of Social Gatherings with Friends and Family: Patient declined    Attends Religious Services: Patient declined    Active Member of Clubs or Organizations: No    Attends Engineer, structural: Not on file    Marital Status: Married  Intimate Partner Violence: Unknown (06/10/2021)   Received from [employer], [facility]   HITS    Physically Hurt: Not on file    Insult or Talk Down To: Not on file    Threaten Physical Harm: Not on file    Scream or Curse: Not on file    Outpatient Encounter Medications as of 11/24/2022  Medication Sig    hydrochlorothiazide (HYDRODIURIL) 25 MG tablet Take 1 tablet (25 mg total) by mouth daily.   lisinopril (ZESTRIL) 40 MG tablet Take 1 tablet (40 mg total) by mouth daily.   rosuvastatin (CRESTOR) 10 MG tablet Take 1 tablet (10 mg total) by mouth daily.   sertraline (ZOLOFT) 100 MG tablet Take 1 tablet (100 mg total) by mouth daily.   No facility-administered encounter medications on file as of 11/24/2022.    No Known Allergies  Review of Systems As per HPI  Objective:  BP 135/83   Pulse 62   Temp 98.5 F (36.9 C)   Ht 5\' 4"  (1.626 m)   Wt 179 lb (81.2 kg)   SpO2 98%   BMI 30.73 kg/m    Wt Readings from Last 3 Encounters:  11/24/22 179 lb (81.2 kg)  09/27/22 182 lb (82.6 kg)  08/03/22 172 lb 9.6 oz (78.3 kg)   Physical Exam Constitutional:      General: He is awake. He is not in acute distress.    Appearance: Normal appearance. He is well-developed and well-groomed. He is obese. He is not ill-appearing, toxic-appearing or diaphoretic.  Cardiovascular:     Rate and Rhythm: Normal rate and regular rhythm.     Pulses: Normal pulses.          Radial pulses are 2+ on the right side and 2+ on the left side.       Posterior tibial pulses are 2+ on the right side and 2+ on the left side.     Heart sounds: Normal heart sounds. No murmur heard.    No gallop.  Pulmonary:     Effort: Pulmonary effort is normal. No respiratory distress.     Breath sounds: Normal breath sounds. No stridor. No wheezing, rhonchi or rales.  Musculoskeletal:     Cervical back: Full passive range of motion without pain and neck supple.     Right lower leg: No edema.     Left lower leg: No edema.  Skin:    General: Skin is warm.     Capillary Refill: Capillary refill takes less than 2 seconds.  Neurological:     General: No focal deficit present.     Mental Status: He is alert, oriented to person, place, and time and easily aroused. Mental status is at baseline.     GCS: GCS eye subscore is 4. GCS  verbal subscore is 5. GCS motor subscore is 6.     Motor: No weakness.  Psychiatric:        Attention and Perception: Attention and perception normal.        Mood and Affect: Mood and affect normal.        Speech: Speech normal.        Behavior: Behavior normal. Behavior is cooperative.  Thought Content: Thought content normal. Thought content does not include homicidal or suicidal ideation. Thought content does not include homicidal or suicidal plan.        Cognition and Memory: Cognition and memory normal.        Judgment: Judgment normal.    Results for orders placed or performed in visit on 09/27/22  CMP14+EGFR  Result Value Ref Range   Glucose 96 70 - 99 mg/dL   BUN 13 8 - 27 mg/dL   Creatinine, Ser 5.42 0.76 - 1.27 mg/dL   eGFR 80 >70 WC/BJS/2.83   BUN/Creatinine Ratio 12 10 - 24   Sodium 140 134 - 144 mmol/L   Potassium 5.0 3.5 - 5.2 mmol/L   Chloride 101 96 - 106 mmol/L   CO2 28 20 - 29 mmol/L   Calcium 9.7 8.6 - 10.2 mg/dL   Total Protein 6.9 6.0 - 8.5 g/dL   Albumin 4.6 3.8 - 4.9 g/dL   Globulin, Total 2.3 1.5 - 4.5 g/dL   Bilirubin Total 0.2 0.0 - 1.2 mg/dL   Alkaline Phosphatase 79 44 - 121 IU/L   AST 24 0 - 40 IU/L   ALT 32 0 - 44 IU/L  Vitamin B12  Result Value Ref Range   Vitamin B-12 243 232 - 1,245 pg/mL       11/24/2022    8:00 AM 09/27/2022    3:41 PM 05/19/2022    3:58 PM 05/05/2022    9:09 AM 03/09/2018    1:32 PM  Depression screen PHQ 2/9  Decreased Interest 0 0 0 0 0  Down, Depressed, Hopeless 0 0 0 0 0  PHQ - 2 Score 0 0 0 0 0  Altered sleeping 0 1 0 0   Tired, decreased energy 0 1 0 0   Change in appetite 0 1 0 0   Feeling bad or failure about yourself  0 0 0 0   Trouble concentrating 0 0 0 0   Moving slowly or fidgety/restless 0 0 0 0   Suicidal thoughts 0 0 0 0   PHQ-9 Score 0 3 0 0   Difficult doing work/chores  Somewhat difficult Not difficult at all Not difficult at all        11/24/2022    8:00 AM 09/27/2022    3:42 PM  05/19/2022    3:57 PM 05/05/2022    9:09 AM  GAD 7 : Generalized Anxiety Score  Nervous, Anxious, on Edge 1 1 0 0  Control/stop worrying 0 0 0 0  Worry too much - different things 0 1 0 0  Trouble relaxing 0 1 0 0  Restless 0 0 0 0  Easily annoyed or irritable 1 1 0 0  Afraid - awful might happen 0 0 0 0  Total GAD 7 Score 2 4 0 0  Anxiety Difficulty  Not difficult at all Not difficult at all Not difficult at all   Pertinent labs & imaging results that were available during my care of the patient were reviewed by me and considered in my medical decision making.  Assessment & Plan:  Sohail was seen today for medical management of chronic issues.  Diagnoses and all orders for this visit:  Primary hypertension Reports home values at goal. Will not make adjustments to regimen at this time. Patient to continue to monitor measurements at home.   Prediabetes Reinforced diet and lifestyle changes.  Labs as below. Will communicate results to patient once available. Will await results to  determine next steps.  -     Bayer DCA Hb A1c Waived -     CBC with Differential/Platelet -     BMP8+EGFR  Depression, major, single episode, mild (HCC) Well controlled. Denies SI. Safety contract established. Continue current regimen.   Anxiety Well controlled. Denies SI. Safety contract established. Continue current regimen.   Dyslipidemia Reinforced diet and lifestyle changes.  Labs as below. Will communicate results to patient once available. Will await results to determine next steps.  -     Lipid panel    Continue all other maintenance medications.  Follow up plan: Return in about 3 months (around 02/23/2023) for Chronic Condition Follow up.   Continue healthy lifestyle choices, including diet (rich in fruits, vegetables, and lean proteins, and low in salt and simple carbohydrates) and exercise (at least 30 minutes of moderate physical activity daily).  Written and verbal instructions  provided   The above assessment and management plan was discussed with the patient. The patient verbalized understanding of and has agreed to the management plan. Patient is aware to call the clinic if they develop any new symptoms or if symptoms persist or worsen. Patient is aware when to return to the clinic for a follow-up visit. Patient educated on when it is appropriate to go to the emergency department.   Neale Burly, DNP-FNP [facility] [address] [place], [place] [address] [phone]

## 2022-11-25 LAB — CBC WITH DIFFERENTIAL/PLATELET
Basophils Absolute: 0 10*3/uL (ref 0.0–0.2)
Basos: 0 %
EOS (ABSOLUTE): 0.2 10*3/uL (ref 0.0–0.4)
Eos: 2 %
Hematocrit: 44.5 % (ref 37.5–51.0)
Hemoglobin: 14.2 g/dL (ref 13.0–17.7)
Immature Grans (Abs): 0 10*3/uL (ref 0.0–0.1)
Immature Granulocytes: 0 %
Lymphocytes Absolute: 3.1 10*3/uL (ref 0.7–3.1)
Lymphs: 42 %
MCH: 30.1 pg (ref 26.6–33.0)
MCHC: 31.9 g/dL (ref 31.5–35.7)
MCV: 95 fL (ref 79–97)
Monocytes Absolute: 0.6 10*3/uL (ref 0.1–0.9)
Monocytes: 9 %
Neutrophils Absolute: 3.4 10*3/uL (ref 1.4–7.0)
Neutrophils: 47 %
Platelets: 289 10*3/uL (ref 150–450)
RBC: 4.71 x10E6/uL (ref 4.14–5.80)
RDW: 12 % (ref 11.6–15.4)
WBC: 7.3 10*3/uL (ref 3.4–10.8)

## 2022-11-25 LAB — BMP8+EGFR
BUN/Creatinine Ratio: 15 (ref 10–24)
BUN: 16 mg/dL (ref 8–27)
CO2: 25 mmol/L (ref 20–29)
Calcium: 10 mg/dL (ref 8.6–10.2)
Chloride: 102 mmol/L (ref 96–106)
Creatinine, Ser: 1.1 mg/dL (ref 0.76–1.27)
Glucose: 104 mg/dL — ABNORMAL HIGH (ref 70–99)
Potassium: 5.6 mmol/L — ABNORMAL HIGH (ref 3.5–5.2)
Sodium: 141 mmol/L (ref 134–144)
eGFR: 77 mL/min/{1.73_m2} (ref >59)

## 2022-11-25 LAB — LIPID PANEL
Chol/HDL Ratio: 3.3 ratio (ref 0.0–5.0)
Cholesterol, Total: 111 mg/dL (ref 100–199)
HDL: 34 mg/dL — ABNORMAL LOW (ref >39)
LDL Chol Calc (NIH): 62 mg/dL (ref 0–99)
Triglycerides: 72 mg/dL (ref 0–149)
VLDL Cholesterol Cal: 15 mg/dL (ref 5–40)

## 2022-11-29 NOTE — Progress Notes (Signed)
LDL looks great! HDL still a little low. Recommend increasing healthy fats such as fish, avocado, and nuts. Potassium slightly elevated, will need to repeat in 1 week with lab appt. We can complete renal artery ultrasound. If potassium remains elevated at next check, will switch to lasix.  If he is having any symptoms; chest pain, nausea, vomiting, palpitations, patient needs to report to ED. A1C remains in prediabetes range. Recommend healthy lifestyle choices, including diet (rich in fruits, vegetables, and lean proteins, and low in salt and simple carbohydrates) and exercise (at least 30 minutes of moderate physical activity daily). Limit beverages high is sugar. Recommend at least 80-100 oz of water daily.

## 2022-11-29 NOTE — Addendum Note (Signed)
Addended by: Neale Burly on: 11/29/2022 12:58 PM   Modules accepted: Orders

## 2022-11-29 NOTE — Addendum Note (Signed)
Addended by: Neale Burly on: 11/29/2022 12:59 PM   Modules accepted: Orders

## 2022-12-06 ENCOUNTER — Other Ambulatory Visit: Payer: 59

## 2022-12-06 DIAGNOSIS — E875 Hyperkalemia: Secondary | ICD-10-CM

## 2022-12-07 LAB — CMP14+EGFR
ALT: 24 [IU]/L (ref 0–44)
AST: 22 [IU]/L (ref 0–40)
Albumin: 4.7 g/dL (ref 3.8–4.9)
Alkaline Phosphatase: 77 [IU]/L (ref 44–121)
BUN/Creatinine Ratio: 15 (ref 10–24)
BUN: 19 mg/dL (ref 8–27)
Bilirubin Total: 0.2 mg/dL (ref 0.0–1.2)
CO2: 25 mmol/L (ref 20–29)
Calcium: 9.7 mg/dL (ref 8.6–10.2)
Chloride: 105 mmol/L (ref 96–106)
Creatinine, Ser: 1.25 mg/dL (ref 0.76–1.27)
Globulin, Total: 2.3 g/dL (ref 1.5–4.5)
Glucose: 100 mg/dL — ABNORMAL HIGH (ref 70–99)
Potassium: 5.4 mmol/L — ABNORMAL HIGH (ref 3.5–5.2)
Sodium: 143 mmol/L (ref 134–144)
Total Protein: 7 g/dL (ref 6.0–8.5)
eGFR: 66 mL/min/{1.73_m2} (ref >59)

## 2022-12-07 MED ORDER — AMLODIPINE BESYLATE 5 MG PO TABS: 5.0000 mg | ORAL_TABLET | Freq: Every day | ORAL | 0 refills | Status: DC

## 2022-12-07 NOTE — Progress Notes (Signed)
Remains hyperkalemic. Will switch from lisinopril to amlodipine. US renal artery previously ordered, please have patient schedule imaging.

## 2022-12-08 ENCOUNTER — Telehealth: Payer: Self-pay | Admitting: Family Medicine

## 2022-12-08 NOTE — Telephone Encounter (Signed)
Pt says PCP sent in a new medication for him yesterday to start taking for his BP (Amlodipine). He wants to know if he is supposed to stop taking the Lisinopril or is he supposed to take Lisinopril and Amlodipine?

## 2022-12-08 NOTE — Telephone Encounter (Signed)
Patient aware to stop lisinopril and start amlodipine

## 2022-12-22 ENCOUNTER — Ambulatory Visit (HOSPITAL_COMMUNITY): Admission: RE | Admit: 2022-12-22 | Payer: Self-pay | Source: Ambulatory Visit

## 2022-12-23 ENCOUNTER — Ambulatory Visit
Admission: RE | Admit: 2022-12-23 | Discharge: 2022-12-23 | Disposition: A | Discharge status: Home / Self Care | Payer: 59 | Source: Ambulatory Visit | Attending: Family Medicine | Admitting: Family Medicine

## 2022-12-23 DIAGNOSIS — I10 Essential (primary) hypertension: Secondary | ICD-10-CM | POA: Diagnosis present

## 2022-12-27 NOTE — Progress Notes (Signed)
No evidence of renal artery stenosis. Continue current plan for HTN control. Per report, steatosis or fatty liver suggested on imaging. LFT remain in normal range. Recommend diet and exercise. We will work to improve blood pressure control, weight, and cholesterol. Recommend avoiding liver toxins such as alcohol and medications eliminated by the liver like tylenol. Notify the office if there are worsening symptoms of liver cirrhosis such as blood in your bowel movements or vomit, symptoms of infection, belly pain, swollen legs or ankles, trouble breathing, extreme tiredness, confusion, yellowing of the skin or whites of your eyes, called jaundice.

## 2023-02-05 ENCOUNTER — Other Ambulatory Visit: Payer: Self-pay | Admitting: Family Medicine

## 2023-02-23 ENCOUNTER — Encounter: Payer: Self-pay | Admitting: Family Medicine

## 2023-02-23 ENCOUNTER — Ambulatory Visit (INDEPENDENT_AMBULATORY_CARE_PROVIDER_SITE_OTHER): Payer: 59 | Admitting: Family Medicine

## 2023-02-23 VITALS — BP 155/92 | HR 99 | Temp 98.9°F | Ht 64.0 in | Wt 179.0 lb

## 2023-02-23 DIAGNOSIS — I10 Essential (primary) hypertension: Secondary | ICD-10-CM | POA: Diagnosis not present

## 2023-02-23 DIAGNOSIS — F320 Major depressive disorder, single episode, mild: Secondary | ICD-10-CM

## 2023-02-23 DIAGNOSIS — F419 Anxiety disorder, unspecified: Secondary | ICD-10-CM

## 2023-02-23 DIAGNOSIS — K219 Gastro-esophageal reflux disease without esophagitis: Secondary | ICD-10-CM | POA: Diagnosis not present

## 2023-02-23 DIAGNOSIS — Z720 Tobacco use: Secondary | ICD-10-CM

## 2023-02-23 DIAGNOSIS — E875 Hyperkalemia: Secondary | ICD-10-CM

## 2023-02-23 DIAGNOSIS — R7303 Prediabetes: Secondary | ICD-10-CM

## 2023-02-23 DIAGNOSIS — F5104 Psychophysiologic insomnia: Secondary | ICD-10-CM

## 2023-02-23 MED ORDER — AMLODIPINE BESYLATE 10 MG PO TABS: 10.0000 mg | ORAL_TABLET | Freq: Every day | ORAL | 1 refills | Status: DC

## 2023-02-23 MED ORDER — HYDROXYZINE PAMOATE 25 MG PO CAPS: 25.0000 mg | ORAL_CAPSULE | Freq: Three times a day (TID) | ORAL | 0 refills | Status: DC | PRN

## 2023-02-23 NOTE — Progress Notes (Signed)
Subjective:  Patient ID: Terry Ryan, male    DOB: 05-Aug-1960, 62 y.o.   MRN: [id number]  Patient Care Team: Arrie Senate, FNP as PCP - General (Family Medicine)   Chief Complaint:  mood disorder (Recent loss)   HPI: Terry Ryan is a 62 y.o. male presenting on 02/23/2023 for mood disorder (Recent loss)  1. Primary hypertension (Primary) Has BP monitor at home Yes BP at home average states that primary to recent life events his BP was improving. He was averaging 115-128/70 in the evenings. He was averaging 138/80-90s in the mornings.  ROS Denies peripheral edema, changes to vision, palpitations, sweats, SOB, PND, orthopnea Meds amlodipine, hydrochlorothiazide CAD risks smoker, hypertension, hypercholesterolemia/hyperlipidemia  2. Gastroesophageal reflux disease without esophagitis States that symptoms are very well controlled with OTC omeprazole.  Sore throat - no Voice change - no Hemoptysis - no Dysphagia or dyspepsia - no Water brash - no Red Flags (weight loss, hematochezia, melena, weight loss, early satiety, fevers, odynophagia, or persistent vomiting) - no  3. Depression, major, single episode, mild (HCC)/4.Anxiety Patient recently, suddenly lost his wife of 41 years. He was witness to the event and performed CPR prior to EMS arriving, they were unsuccessful in reviving her. He is staying with is daughter and granddaughter intermittently. He is trying to go to work to keep himself distracted. Denies SI. States that he loves his grandchildren too much and just wants to be with them. He states that he is having trouble sleeping. He also states that he feels constantly shaky throughout the day.   5. Tobacco abuse Given recent events, patient has recently started smoking tobacco again. Smoked several years ago and quit for a long period of time. States that he does not want to start drinking, so he is smoking.   6. Prediabetes States that he was doing good  with his diet, but right now he is not taking care of himself.   7. Hyperkalemia Denies any symptoms.   Relevant past medical, surgical, family, and social history reviewed and updated as indicated.  Allergies and medications reviewed and updated. Data reviewed: Chart in Epic.   Past Medical History:  Diagnosis Date   Hypertension    Renal disorder    kidney stones   Sprain of lateral collateral ligament of right knee 12/26/2017    Past Surgical History:  Procedure Laterality Date   cyst removed from left arm and wrist     KNEE ARTHROSCOPY Right 02/25/2019    Social History   Socioeconomic History   Marital status: Married    Spouse name: Not on file   Number of children: Not on file   Years of education: Not on file   Highest education level: 12th grade  Occupational History   Not on file  Tobacco Use   Smoking status: Every Day    Current packs/day: 0.00    Average packs/day: 1 pack/day for 20.0 years (20.0 ttl pk-yrs)    Types: Cigarettes    Start date: 03/07/1982    Last attempt to quit: 03/07/2002    Years since quitting: 20.9   Smokeless tobacco: Never  Vaping Use   Vaping status: Never Used  Substance and Sexual Activity   Alcohol use: Yes    Comment: occasionally   Drug use: No   Sexual activity: Not on file  Other Topics Concern   Not on file  Social History Narrative   Not on file   Social Drivers of  Health   Financial Resource Strain: Low Risk  (02/21/2023)   Overall Financial Resource Strain (CARDIA)    Difficulty of Paying Living Expenses: Not very hard  Food Insecurity: No Food Insecurity (02/21/2023)   Hunger Vital Sign    Worried About Running Out of Food in the Last Year: Never true    Ran Out of Food in the Last Year: Never true  Transportation Needs: No Transportation Needs (02/21/2023)   PRAPARE - Administrator, Civil Service (Medical): No    Lack of Transportation (Non-Medical): No  Physical Activity: Inactive  (02/21/2023)   Exercise Vital Sign    Days of Exercise per Week: 4 days    Minutes of Exercise per Session: 0 min  Stress: Stress Concern Present (02/21/2023)   [employer] of Occupational Health - Occupational Stress Questionnaire    Feeling of Stress : Very much  Social Connections: Moderately Isolated (02/21/2023)   Social Connection and Isolation Panel [NHANES]    Frequency of Communication with Friends and Family: More than three times a week    Frequency of Social Gatherings with Friends and Family: More than three times a week    Attends Religious Services: 1 to 4 times per year    Active Member of [employer] or Organizations: No    Attends Banker Meetings: Not on file    Marital Status: Widowed  Intimate Partner Violence: Unknown (06/10/2021)   Received from [facility]   HITS    Physically Hurt: Not on file    Insult or Talk Down To: Not on file    Threaten Physical Harm: Not on file    Scream or Curse: Not on file    Outpatient Encounter Medications as of 02/23/2023  Medication Sig   amLODipine (NORVASC) 10 MG tablet Take 1 tablet (10 mg total) by mouth daily.   hydrochlorothiazide (HYDRODIURIL) 25 MG tablet Take 1 tablet (25 mg total) by mouth daily.   hydrOXYzine (VISTARIL) 25 MG capsule Take 1 capsule (25 mg total) by mouth every 8 (eight) hours as needed.   rosuvastatin (CRESTOR) 10 MG tablet Take 1 tablet by mouth once daily   sertraline (ZOLOFT) 100 MG tablet Take 1 tablet (100 mg total) by mouth daily.   [DISCONTINUED] amLODipine (NORVASC) 5 MG tablet Take 1 tablet (5 mg total) by mouth daily.   lisinopril (ZESTRIL) 40 MG tablet Take 1 tablet (40 mg total) by mouth daily. (Patient not taking: Reported on 02/23/2023)   No facility-administered encounter medications on file as of 02/23/2023.    No Known Allergies  Review of Systems As per HPI  Objective:  BP (!) 155/92   Pulse 99   Temp 98.9 F (37.2 C)   Ht 5\' 4"  (1.626 m)    Wt 179 lb (81.2 kg)   SpO2 98%   BMI 30.73 kg/m    Wt Readings from Last 3 Encounters:  02/23/23 179 lb (81.2 kg)  11/24/22 179 lb (81.2 kg)  09/27/22 182 lb (82.6 kg)    Physical Exam Constitutional:      General: He is awake. He is not in acute distress.    Appearance: Normal appearance. He is well-developed and well-groomed. He is not ill-appearing, toxic-appearing or diaphoretic.  Cardiovascular:     Rate and Rhythm: Normal rate and regular rhythm.     Pulses: Normal pulses.          Radial pulses are 2+ on the right side and 2+ on the  left side.       Posterior tibial pulses are 2+ on the right side and 2+ on the left side.     Heart sounds: Normal heart sounds. No murmur heard.    No gallop.  Pulmonary:     Effort: Pulmonary effort is normal. No respiratory distress.     Breath sounds: Normal breath sounds. No stridor. No wheezing, rhonchi or rales.  Musculoskeletal:     Cervical back: Full passive range of motion without pain and neck supple.     Right lower leg: No edema.     Left lower leg: No edema.  Skin:    General: Skin is warm.     Capillary Refill: Capillary refill takes less than 2 seconds.  Neurological:     General: No focal deficit present.     Mental Status: He is alert, oriented to person, place, and time and easily aroused. Mental status is at baseline.     GCS: GCS eye subscore is 4. GCS verbal subscore is 5. GCS motor subscore is 6.     Motor: No weakness.  Psychiatric:        Attention and Perception: Attention and perception normal.        Mood and Affect: Mood is anxious. Affect is tearful.        Speech: Speech normal.        Behavior: Behavior is hyperactive. Behavior is cooperative.        Thought Content: Thought content normal. Thought content does not include homicidal or suicidal ideation. Thought content does not include homicidal or suicidal plan.        Cognition and Memory: Cognition and memory normal.        Judgment: Judgment normal.     Results for orders placed or performed in visit on 12/06/22  CMP14+EGFR   Collection Time: 12/06/22  3:31 PM  Result Value Ref Range   Glucose 100 (H) 70 - 99 mg/dL   BUN 19 8 - 27 mg/dL   Creatinine, Ser 8.11 0.76 - 1.27 mg/dL   eGFR 66 >91 YN/WGN/5.62   BUN/Creatinine Ratio 15 10 - 24   Sodium 143 134 - 144 mmol/L   Potassium 5.4 (H) 3.5 - 5.2 mmol/L   Chloride 105 96 - 106 mmol/L   CO2 25 20 - 29 mmol/L   Calcium 9.7 8.6 - 10.2 mg/dL   Total Protein 7.0 6.0 - 8.5 g/dL   Albumin 4.7 3.8 - 4.9 g/dL   Globulin, Total 2.3 1.5 - 4.5 g/dL   Bilirubin Total 0.2 0.0 - 1.2 mg/dL   Alkaline Phosphatase 77 44 - 121 IU/L   AST 22 0 - 40 IU/L   ALT 24 0 - 44 IU/L       02/23/2023    3:49 PM 11/24/2022    8:00 AM 09/27/2022    3:41 PM 05/19/2022    3:58 PM 05/05/2022    9:09 AM  Depression screen PHQ 2/9  Decreased Interest 2 0 0 0 0  Down, Depressed, Hopeless 2 0 0 0 0  PHQ - 2 Score 4 0 0 0 0  Altered sleeping 3 0 1 0 0  Tired, decreased energy 2 0 1 0 0  Change in appetite 2 0 1 0 0  Feeling bad or failure about yourself  2 0 0 0 0  Trouble concentrating 3 0 0 0 0  Moving slowly or fidgety/restless 2 0 0 0 0  Suicidal thoughts 0 0 0  0 0  PHQ-9 Score 18 0 3 0 0  Difficult doing work/chores Somewhat difficult  Somewhat difficult Not difficult at all Not difficult at all       02/23/2023    3:50 PM 11/24/2022    8:00 AM 09/27/2022    3:42 PM 05/19/2022    3:57 PM  GAD 7 : Generalized Anxiety Score  Nervous, Anxious, on Edge 3 1 1  0  Control/stop worrying 3 0 0 0  Worry too much - different things 3 0 1 0  Trouble relaxing 3 0 1 0  Restless 3 0 0 0  Easily annoyed or irritable 0 1 1 0  Afraid - awful might happen 1 0 0 0  Total GAD 7 Score 16 2 4  0  Anxiety Difficulty   Not difficult at all Not difficult at all      Pertinent labs & imaging results that were available during my care of the patient were reviewed by me and considered in my medical decision  making.  Assessment & Plan:  Gianluca was seen today for mood disorder.  Diagnoses and all orders for this visit:  Primary hypertension Will increase medication as below. Patient to continue to monitor BP at home. Patient to bring measurements to follow up appt for review. Labs as below. Will communicate results to patient once available. Will await results to determine next steps.  -     amLODipine (NORVASC) 10 MG tablet; Take 1 tablet (10 mg total) by mouth daily. -     BMP8+EGFR  Gastroesophageal reflux disease without esophagitis Well controlled. Continue current regimen.   Depression, major, single episode, mild (HCC) Not at goal with current life event. Denies SI. Safety contract established. Resources provided. Will continue to follow up with patient.   Anxiety Will start medication as below to assist with patient anxiety symptoms during the day and ability to sleep at night. Denies SI. Safety contract established. Resources provided. Will continue to follow up with patient.  -     hydrOXYzine (VISTARIL) 25 MG capsule; Take 1 capsule (25 mg total) by mouth every 8 (eight) hours as needed.  Tobacco abuse Counseled patient on use. Will discuss screening for lung cancer at future visit.   Prediabetes Labs as below. Will communicate results to patient once available. Will await results to determine next steps.  -     Bayer DCA Hb A1c Waived  Hyperkalemia Labs as below. Will communicate results to patient once available. Will await results to determine next steps.  -     BMP8+EGFR  Psychophysiological insomnia Will start medication as below to assist with patient anxiety symptoms during the day and ability to sleep at night. Denies SI. Safety contract established. Resources provided. Will continue to follow up with patient.  -     hydrOXYzine (VISTARIL) 25 MG capsule; Take 1 capsule (25 mg total) by mouth every 8 (eight) hours as needed.     Continue all other maintenance  medications.  Follow up plan: Return in about 3 months (around 05/24/2023) for Chronic Condition Follow up.   Continue healthy lifestyle choices, including diet (rich in fruits, vegetables, and lean proteins, and low in salt and simple carbohydrates) and exercise (at least 30 minutes of moderate physical activity daily).  Written and verbal instructions provided   The above assessment and management plan was discussed with the patient. The patient verbalized understanding of and has agreed to the management plan. Patient is aware to call the clinic if  they develop any new symptoms or if symptoms persist or worsen. Patient is aware when to return to the clinic for a follow-up visit. Patient educated on when it is appropriate to go to the emergency department.   Neale Burly, DNP-FNP [facility] [address] [place], [place] [address] [phone]

## 2023-02-24 LAB — BMP8+EGFR
BUN/Creatinine Ratio: 15 (ref 10–24)
BUN: 16 mg/dL (ref 8–27)
CO2: 24 mmol/L (ref 20–29)
Calcium: 10 mg/dL (ref 8.6–10.2)
Chloride: 98 mmol/L (ref 96–106)
Creatinine, Ser: 1.05 mg/dL (ref 0.76–1.27)
Glucose: 86 mg/dL (ref 70–99)
Potassium: 5.2 mmol/L (ref 3.5–5.2)
Sodium: 139 mmol/L (ref 134–144)
eGFR: 81 mL/min/{1.73_m2} (ref >59)

## 2023-02-24 LAB — BAYER DCA HB A1C WAIVED: HB A1C (BAYER DCA - WAIVED): 6.1 % — ABNORMAL HIGH (ref 4.8–5.6)

## 2023-02-24 NOTE — Progress Notes (Signed)
Remains in prediabetes range. CMP normal! Potassium looks good!

## 2023-03-18 ENCOUNTER — Other Ambulatory Visit: Payer: Self-pay | Admitting: Family Medicine

## 2023-03-18 DIAGNOSIS — F320 Major depressive disorder, single episode, mild: Secondary | ICD-10-CM

## 2023-03-18 DIAGNOSIS — F419 Anxiety disorder, unspecified: Secondary | ICD-10-CM

## 2023-03-19 ENCOUNTER — Other Ambulatory Visit: Payer: Self-pay | Admitting: Family Medicine

## 2023-03-19 DIAGNOSIS — F419 Anxiety disorder, unspecified: Secondary | ICD-10-CM

## 2023-03-19 DIAGNOSIS — F5104 Psychophysiologic insomnia: Secondary | ICD-10-CM

## 2023-04-26 ENCOUNTER — Other Ambulatory Visit: Payer: Self-pay | Admitting: Family Medicine

## 2023-04-26 DIAGNOSIS — I10 Essential (primary) hypertension: Secondary | ICD-10-CM

## 2023-05-03 ENCOUNTER — Other Ambulatory Visit: Payer: Self-pay | Admitting: Family Medicine

## 2023-05-17 ENCOUNTER — Other Ambulatory Visit: Payer: Self-pay | Admitting: Family Medicine

## 2023-05-17 DIAGNOSIS — F419 Anxiety disorder, unspecified: Secondary | ICD-10-CM

## 2023-05-17 DIAGNOSIS — F5104 Psychophysiologic insomnia: Secondary | ICD-10-CM

## 2023-05-24 ENCOUNTER — Encounter: Payer: Self-pay | Admitting: Family Medicine

## 2023-05-24 ENCOUNTER — Ambulatory Visit (INDEPENDENT_AMBULATORY_CARE_PROVIDER_SITE_OTHER): Payer: 59 | Admitting: Family Medicine

## 2023-05-24 VITALS — BP 129/70 | HR 96 | Temp 98.9°F | Ht 64.0 in | Wt 180.0 lb

## 2023-05-24 DIAGNOSIS — M79641 Pain in right hand: Secondary | ICD-10-CM

## 2023-05-24 DIAGNOSIS — F320 Major depressive disorder, single episode, mild: Secondary | ICD-10-CM

## 2023-05-24 DIAGNOSIS — I10 Essential (primary) hypertension: Secondary | ICD-10-CM | POA: Diagnosis not present

## 2023-05-24 DIAGNOSIS — Z720 Tobacco use: Secondary | ICD-10-CM | POA: Diagnosis not present

## 2023-05-24 DIAGNOSIS — Z8639 Personal history of other endocrine, nutritional and metabolic disease: Secondary | ICD-10-CM

## 2023-05-24 DIAGNOSIS — F419 Anxiety disorder, unspecified: Secondary | ICD-10-CM

## 2023-05-24 DIAGNOSIS — E785 Hyperlipidemia, unspecified: Secondary | ICD-10-CM

## 2023-05-24 DIAGNOSIS — R7303 Prediabetes: Secondary | ICD-10-CM

## 2023-05-24 DIAGNOSIS — F1990 Other psychoactive substance use, unspecified, uncomplicated: Secondary | ICD-10-CM

## 2023-05-24 DIAGNOSIS — M79642 Pain in left hand: Secondary | ICD-10-CM

## 2023-05-24 MED ORDER — NAPROXEN 500 MG PO TABS
500.0000 mg | ORAL_TABLET | Freq: Two times a day (BID) | ORAL | 0 refills | Status: DC
Start: 2023-05-24 — End: 2023-08-29

## 2023-05-24 MED ORDER — NICOTINE POLACRILEX 4 MG MT GUM: 4.0000 mg | CHEWING_GUM | OROMUCOSAL | 0 refills | Status: DC | PRN

## 2023-05-24 NOTE — Progress Notes (Signed)
 Subjective:  Patient ID: Terry Ryan, male    DOB: September 17, 1960, 63 y.o.   MRN: [id number]  Patient Care Team: Arrie Senate, FNP as PCP - General (Family Medicine)   Chief Complaint:  Medical Management of Chronic Issues (3 month follow up)   HPI: Terry Ryan is a 63 y.o. male presenting on 05/24/2023 for Medical Management of Chronic Issues (3 month follow up)  HPI HTN  There are no diagnoses linked to this encounter.  Has BP monitor at home Yes BP at home average 120-129/60-70 ROS Denies peripheral edema, changes to vision, chest pain, headaches, palpitations, sweats, SOB, PND, orthopnea Meds amlodipine and lisinopril, hydrochlorothiazide CAD risks hypertension, hypercholesterolemia/hyperlipidemia  Tobacco Use  States that he is trying to quit  Switched to light  Smoking 1/2 to 3/4 PPD  Started smoking at 63 years old.  Smoked until 63 years old  Quit for 21 years and recently restarted .  Anxiety/Depression  States that his main stress is dealing with grief and work. Plans to switch jobs soon. He is taking two vistaril at night and it is helping him sleep. He is also taking CBD sleep gummies on occasion. He is also taking "peach" from a friend. States that he has not taken it in 3 weeks.   Dyslipidemia  He is taking crestor.  Denies side effects.   Prediabetes  He states that he is trying to make changes to diet and exercise. Walking  Hopes that a gym is coming to work.   Hand pain  States that both hands have been stiff and hurting. Feels that it is getting worse. Using warm compresses to help. Grandmother and mother had arthritis in hands. He is taking ibuprofen. He is taking it 3 times per week.   Relevant past medical, surgical, family, and social history reviewed and updated as indicated.  Allergies and medications reviewed and updated. Data reviewed: Chart in Epic.   Past Medical History:  Diagnosis Date   Hypertension    Renal disorder     kidney stones   Sprain of lateral collateral ligament of right knee 12/26/2017    Past Surgical History:  Procedure Laterality Date   cyst removed from left arm and wrist     KNEE ARTHROSCOPY Right 02/25/2019    Social History   Socioeconomic History   Marital status: Married    Spouse name: Not on file   Number of children: Not on file   Years of education: Not on file   Highest education level: 12th grade  Occupational History   Not on file  Tobacco Use   Smoking status: Every Day    Current packs/day: 0.00    Average packs/day: 1 pack/day for 20.0 years (20.0 ttl pk-yrs)    Types: Cigarettes    Start date: 03/07/1982    Last attempt to quit: 03/07/2002    Years since quitting: 21.2   Smokeless tobacco: Never  Vaping Use   Vaping status: Never Used  Substance and Sexual Activity   Alcohol use: Yes    Comment: occasionally   Drug use: No   Sexual activity: Not on file  Other Topics Concern   Not on file  Social History Narrative   Not on file   Social Drivers of Health   Financial Resource Strain: Low Risk  (02/21/2023)   Overall Financial Resource Strain (CARDIA)    Difficulty of Paying Living Expenses: Not very hard  Food Insecurity: No Food Insecurity (05/24/2023)  Hunger Vital Sign    Worried About Running Out of Food in the Last Year: Never true    Ran Out of Food in the Last Year: Never true  Transportation Needs: No Transportation Needs (05/24/2023)   PRAPARE - Administrator, Civil Service (Medical): No    Lack of Transportation (Non-Medical): No  Physical Activity: Inactive (02/21/2023)   Exercise Vital Sign    Days of Exercise per Week: 4 days    Minutes of Exercise per Session: 0 min  Stress: Stress Concern Present (02/21/2023)   [employer] of Occupational Health - Occupational Stress Questionnaire    Feeling of Stress : Very much  Social Connections: Moderately Isolated (02/21/2023)   Social Connection and Isolation Panel  [NHANES]    Frequency of Communication with Friends and Family: More than three times a week    Frequency of Social Gatherings with Friends and Family: More than three times a week    Attends Religious Services: 1 to 4 times per year    Active Member of [employer] or Organizations: No    Attends Banker Meetings: Not on file    Marital Status: Widowed  Intimate Partner Violence: Not At Risk (05/24/2023)   Humiliation, Afraid, Rape, and Kick questionnaire    Fear of Current or Ex-Partner: No    Emotionally Abused: No    Physically Abused: No    Sexually Abused: No    Outpatient Encounter Medications as of 05/24/2023  Medication Sig   amLODipine (NORVASC) 10 MG tablet Take 1 tablet (10 mg total) by mouth daily.   hydrochlorothiazide (HYDRODIURIL) 25 MG tablet Take 1 tablet (25 mg total) by mouth daily.   hydrOXYzine (VISTARIL) 25 MG capsule TAKE 1 CAPSULE BY MOUTH EVERY 8 HOURS AS NEEDED   lisinopril (ZESTRIL) 40 MG tablet Take 1 tablet by mouth once daily   rosuvastatin (CRESTOR) 10 MG tablet Take 1 tablet by mouth once daily   sertraline (ZOLOFT) 100 MG tablet Take 1 tablet by mouth once daily   No facility-administered encounter medications on file as of 05/24/2023.    No Known Allergies  Review of Systems As per HPI  Objective:  BP 129/70   Pulse 96   Temp 98.9 F (37.2 C)   Ht 5\' 4"  (1.626 m)   Wt 180 lb (81.6 kg)   SpO2 93%   BMI 30.90 kg/m    Wt Readings from Last 3 Encounters:  05/24/23 180 lb (81.6 kg)  02/23/23 179 lb (81.2 kg)  11/24/22 179 lb (81.2 kg)    Physical Exam Constitutional:      General: He is awake. He is not in acute distress.    Appearance: Normal appearance. He is well-developed and well-groomed. He is not ill-appearing, toxic-appearing or diaphoretic.  Cardiovascular:     Rate and Rhythm: Normal rate and regular rhythm.     Pulses: Normal pulses.          Radial pulses are 2+ on the right side and 2+ on the left side.        Posterior tibial pulses are 2+ on the right side and 2+ on the left side.     Heart sounds: Normal heart sounds. No murmur heard.    No gallop.  Pulmonary:     Effort: Pulmonary effort is normal. No respiratory distress.     Breath sounds: Normal breath sounds. No stridor. No wheezing, rhonchi or rales.  Musculoskeletal:     Right hand: No swelling,  deformity, lacerations, tenderness or bony tenderness. Decreased range of motion. Normal strength. There is no disruption of two-point discrimination. Normal capillary refill. Normal pulse.     Left hand: No swelling, deformity, lacerations, tenderness or bony tenderness. Decreased range of motion. Normal strength. There is no disruption of two-point discrimination. Normal capillary refill. Normal pulse.     Cervical back: Full passive range of motion without pain and neck supple.     Right lower leg: No edema.     Left lower leg: No edema.  Skin:    General: Skin is warm.     Capillary Refill: Capillary refill takes less than 2 seconds.  Neurological:     General: No focal deficit present.     Mental Status: He is alert, oriented to person, place, and time and easily aroused. Mental status is at baseline.     GCS: GCS eye subscore is 4. GCS verbal subscore is 5. GCS motor subscore is 6.     Motor: No weakness.  Psychiatric:        Attention and Perception: Attention and perception normal.        Mood and Affect: Mood and affect normal.        Speech: Speech normal.        Behavior: Behavior normal. Behavior is cooperative.        Thought Content: Thought content normal. Thought content does not include homicidal or suicidal ideation. Thought content does not include homicidal or suicidal plan.        Cognition and Memory: Cognition and memory normal.        Judgment: Judgment normal.     Results for orders placed or performed in visit on 02/23/23  Bayer DCA Hb A1c Waived   Collection Time: 02/23/23  4:23 PM  Result Value Ref Range   HB  A1C (BAYER DCA - WAIVED) 6.1 (H) 4.8 - 5.6 %  BMP8+EGFR   Collection Time: 02/23/23  4:29 PM  Result Value Ref Range   Glucose 86 70 - 99 mg/dL   BUN 16 8 - 27 mg/dL   Creatinine, Ser 9.14 0.76 - 1.27 mg/dL   eGFR 81 >78 GN/FAO/1.30   BUN/Creatinine Ratio 15 10 - 24   Sodium 139 134 - 144 mmol/L   Potassium 5.2 3.5 - 5.2 mmol/L   Chloride 98 96 - 106 mmol/L   CO2 24 20 - 29 mmol/L   Calcium 10.0 8.6 - 10.2 mg/dL       8/65/7846    9:62 PM 02/23/2023    3:49 PM 11/24/2022    8:00 AM 09/27/2022    3:41 PM 05/19/2022    3:58 PM  Depression screen PHQ 2/9  Decreased Interest 0 2 0 0 0  Down, Depressed, Hopeless 1 2 0 0 0  PHQ - 2 Score 1 4 0 0 0  Altered sleeping 1 3 0 1 0  Tired, decreased energy 1 2 0 1 0  Change in appetite 0 2 0 1 0  Feeling bad or failure about yourself  0 2 0 0 0  Trouble concentrating 0 3 0 0 0  Moving slowly or fidgety/restless 0 2 0 0 0  Suicidal thoughts 0 0 0 0 0  PHQ-9 Score 3 18 0 3 0  Difficult doing work/chores Somewhat difficult Somewhat difficult  Somewhat difficult Not difficult at all       05/24/2023    3:48 PM 02/23/2023    3:50 PM 11/24/2022  8:00 AM 09/27/2022    3:42 PM  GAD 7 : Generalized Anxiety Score  Nervous, Anxious, on Edge 1 3 1 1   Control/stop worrying 0 3 0 0  Worry too much - different things 0 3 0 1  Trouble relaxing 1 3 0 1  Restless 0 3 0 0  Easily annoyed or irritable 0 0 1 1  Afraid - awful might happen 0 1 0 0  Total GAD 7 Score 2 16 2 4   Anxiety Difficulty Somewhat difficult   Not difficult at all    Pertinent labs & imaging results that were available during my care of the patient were reviewed by me and considered in my medical decision making.  Assessment & Plan:  Terry Ryan was seen today for medical management of chronic issues.  Diagnoses and all orders for this visit:  Primary hypertension Well controlled. Continue current regimen. Labs as below. Will communicate results to patient once available.  Will await results to determine next steps.  -     CMP14+EGFR -     CBC with Differential/Platelet  Depression, major, single episode, mild (HCC) Well controlled. Continue current regimen.  Denies SI. Declines counseling. Labs as below. Will communicate results to patient once available. Will await results to determine next steps.  -     CMP14+EGFR -     CBC with Differential/Platelet  Anxiety As above.  -     CMP14+EGFR -     CBC with Differential/Platelet  Tobacco abuse Praised patient for desire to quit. Will send in gum as below to assist with cravings. Labs as below. Will communicate results to patient once available. Will await results to determine next steps.  -     nicotine polacrilex (NICORETTE) 4 MG gum; Take 1 each (4 mg total) by mouth as needed for smoking cessation. -     CMP14+EGFR -     CBC with Differential/Platelet  Prediabetes Labs as below. Will communicate results to patient once available. Will await results to determine next steps. Discussed with patient to continue healthy lifestyle choices, including diet (rich in fruits, vegetables, and lean proteins, and low in salt and simple carbohydrates) and exercise (at least 30 minutes of moderate physical activity daily). Limit beverages high is sugar. Recommended at least 80-100 oz of water daily.  -     Bayer DCA Hb A1c Waived  Dyslipidemia Labs as below. Will communicate results to patient once available. Will await results to determine next steps.  Not fasting  -     Lipid panel  Personal history of other endocrine, nutritional and metabolic disease Labs as below. Will communicate results to patient once available. Will await results to determine next steps.  -     TSH -     VITAMIN D 25 Hydroxy (Vit-D Deficiency, Fractures)  Bilateral hand pain Will start with conservative management as below. Patient instructed to not take additional NSAIDs. Labs as below. Will communicate results to patient once available.  Will await results to determine next steps.  -     Uric Acid -     naproxen (NAPROSYN) 500 MG tablet; Take 1 tablet (500 mg total) by mouth 2 (two) times daily with a meal.  Substance use Patient admitted to rare use of other substances  Labs as below. Will communicate results to patient once available. Will await results to determine next steps.  -     ToxASSURE Select 13 (MW), Urine    Continue all other maintenance medications.  Follow up plan: Return in about 3 months (around 08/24/2023) for Chronic Condition Follow up.   Continue healthy lifestyle choices, including diet (rich in fruits, vegetables, and lean proteins, and low in salt and simple carbohydrates) and exercise (at least 30 minutes of moderate physical activity daily).  Written and verbal instructions provided   The above assessment and management plan was discussed with the patient. The patient verbalized understanding of and has agreed to the management plan. Patient is aware to call the clinic if they develop any new symptoms or if symptoms persist or worsen. Patient is aware when to return to the clinic for a follow-up visit. Patient educated on when it is appropriate to go to the emergency department.   Neale Burly, DNP-FNP [facility] [address] [place], [place] [address] [phone]

## 2023-05-24 NOTE — Patient Instructions (Signed)
Weeks 1 to 6: Chew 1 piece of gum every 1 to 2 hours (maximum: 24 pieces/Abramo); to increase chances of quitting, chew at least 9 pieces/Wolak during the first 6 weeks.    Weeks 7 to 9: Chew 1 piece of gum every 2 to 4 hours (maximum: 24 pieces/Fadeley).    Weeks 10 to 12: Chew 1 piece of gum every 4 to 8 hours (maximum: 24 pieces/Mcnicholas).

## 2023-05-25 LAB — CMP14+EGFR
ALT: 38 IU/L (ref 0–44)
AST: 28 IU/L (ref 0–40)
Albumin: 4.9 g/dL (ref 3.9–4.9)
Alkaline Phosphatase: 87 IU/L (ref 44–121)
BUN/Creatinine Ratio: 15 (ref 10–24)
BUN: 15 mg/dL (ref 8–27)
Bilirubin Total: 0.2 mg/dL (ref 0.0–1.2)
CO2: 24 mmol/L (ref 20–29)
Calcium: 10.1 mg/dL (ref 8.6–10.2)
Chloride: 102 mmol/L (ref 96–106)
Creatinine, Ser: 1 mg/dL (ref 0.76–1.27)
Globulin, Total: 2.7 g/dL (ref 1.5–4.5)
Glucose: 94 mg/dL (ref 70–99)
Potassium: 4.5 mmol/L (ref 3.5–5.2)
Sodium: 143 mmol/L (ref 134–144)
Total Protein: 7.6 g/dL (ref 6.0–8.5)
eGFR: 86 mL/min/{1.73_m2} (ref >59)

## 2023-05-25 LAB — BAYER DCA HB A1C WAIVED: HB A1C (BAYER DCA - WAIVED): 5.9 % — ABNORMAL HIGH (ref 4.8–5.6)

## 2023-05-25 LAB — CBC WITH DIFFERENTIAL/PLATELET
Basophils Absolute: 0 10*3/uL (ref 0.0–0.2)
Basos: 0 %
EOS (ABSOLUTE): 0.1 10*3/uL (ref 0.0–0.4)
Eos: 1 %
Hematocrit: 47.5 % (ref 37.5–51.0)
Hemoglobin: 15.9 g/dL (ref 13.0–17.7)
Immature Grans (Abs): 0.1 10*3/uL (ref 0.0–0.1)
Immature Granulocytes: 1 %
Lymphocytes Absolute: 3.5 10*3/uL — ABNORMAL HIGH (ref 0.7–3.1)
Lymphs: 36 %
MCH: 30.2 pg (ref 26.6–33.0)
MCHC: 33.5 g/dL (ref 31.5–35.7)
MCV: 90 fL (ref 79–97)
Monocytes Absolute: 1.1 10*3/uL — ABNORMAL HIGH (ref 0.1–0.9)
Monocytes: 11 %
Neutrophils Absolute: 5 10*3/uL (ref 1.4–7.0)
Neutrophils: 51 %
Platelets: 323 10*3/uL (ref 150–450)
RBC: 5.27 x10E6/uL (ref 4.14–5.80)
RDW: 13.1 % (ref 11.6–15.4)
WBC: 9.8 10*3/uL (ref 3.4–10.8)

## 2023-05-25 LAB — VITAMIN D 25 HYDROXY (VIT D DEFICIENCY, FRACTURES): Vit D, 25-Hydroxy: 33.2 ng/mL (ref 30.0–100.0)

## 2023-05-25 LAB — URIC ACID: Uric Acid: 5.9 mg/dL (ref 3.8–8.4)

## 2023-05-25 LAB — LIPID PANEL
Chol/HDL Ratio: 3.4 ratio (ref 0.0–5.0)
Cholesterol, Total: 128 mg/dL (ref 100–199)
HDL: 38 mg/dL — ABNORMAL LOW (ref >39)
LDL Chol Calc (NIH): 69 mg/dL (ref 0–99)
Triglycerides: 113 mg/dL (ref 0–149)
VLDL Cholesterol Cal: 21 mg/dL (ref 5–40)

## 2023-05-25 LAB — TSH: TSH: 1.47 u[IU]/mL (ref 0.450–4.500)

## 2023-05-27 LAB — TOXASSURE SELECT 13 (MW), URINE

## 2023-05-30 ENCOUNTER — Encounter: Payer: Self-pay | Admitting: Family Medicine

## 2023-05-30 NOTE — Progress Notes (Signed)
 Cholesterol stable. Continue crestor. HDL "good cholesterol" is low. Can increase healthy fats in diet such as fish, avocado, nuts. Mild changes on CBC, not concerning at this time. A1C well controlled.

## 2023-06-15 ENCOUNTER — Other Ambulatory Visit: Payer: Self-pay | Admitting: Family Medicine

## 2023-06-15 DIAGNOSIS — F320 Major depressive disorder, single episode, mild: Secondary | ICD-10-CM

## 2023-06-15 DIAGNOSIS — F419 Anxiety disorder, unspecified: Secondary | ICD-10-CM

## 2023-06-16 ENCOUNTER — Other Ambulatory Visit: Payer: Self-pay | Admitting: Family Medicine

## 2023-06-16 DIAGNOSIS — F5104 Psychophysiologic insomnia: Secondary | ICD-10-CM

## 2023-06-16 DIAGNOSIS — F419 Anxiety disorder, unspecified: Secondary | ICD-10-CM

## 2023-07-14 ENCOUNTER — Other Ambulatory Visit: Payer: Self-pay | Admitting: Family Medicine

## 2023-07-14 DIAGNOSIS — F5104 Psychophysiologic insomnia: Secondary | ICD-10-CM

## 2023-07-14 DIAGNOSIS — F419 Anxiety disorder, unspecified: Secondary | ICD-10-CM

## 2023-07-25 ENCOUNTER — Other Ambulatory Visit: Payer: Self-pay | Admitting: Family Medicine

## 2023-07-25 DIAGNOSIS — I10 Essential (primary) hypertension: Secondary | ICD-10-CM

## 2023-07-26 ENCOUNTER — Ambulatory Visit (INDEPENDENT_AMBULATORY_CARE_PROVIDER_SITE_OTHER): Admitting: Family Medicine

## 2023-07-26 ENCOUNTER — Encounter: Payer: Self-pay | Admitting: Family Medicine

## 2023-07-26 VITALS — BP 149/84 | HR 112 | Temp 98.9°F | Ht 64.0 in | Wt 182.0 lb

## 2023-07-26 DIAGNOSIS — M5441 Lumbago with sciatica, right side: Secondary | ICD-10-CM

## 2023-07-26 DIAGNOSIS — I10 Essential (primary) hypertension: Secondary | ICD-10-CM

## 2023-07-26 MED ORDER — METHYLPREDNISOLONE ACETATE 40 MG/ML IJ SUSP
60.0000 mg | Freq: Once | INTRAMUSCULAR | Status: AC
  Administered 2023-07-26: 60 mg via INTRAMUSCULAR

## 2023-07-26 MED ORDER — PREDNISONE 20 MG PO TABS: 40.0000 mg | ORAL_TABLET | Freq: Every day | ORAL | 0 refills | Status: AC

## 2023-07-26 MED ORDER — METHOCARBAMOL 750 MG PO TABS: 750.0000 mg | ORAL_TABLET | Freq: Four times a day (QID) | ORAL | 0 refills | Status: DC

## 2023-07-26 NOTE — Progress Notes (Signed)
 Subjective:  Patient ID: Terry Ryan, male    DOB: 1960/03/22, 63 y.o.   MRN: [id number]  Patient Care Team: Chrystine Crate, FNP as PCP - General (Family Medicine)   Chief Complaint:  back pain radiating down right leg (X 2 weeks)  HPI: Terry Ryan is a 63 y.o. male presenting on 07/26/2023 for back pain radiating down right leg (X 2 weeks)  HPI States that 1.5 weeks ago he bent over to pick up dog and injured his back. He states that he tried to work through the pain and was able to last week. Rested over the weekend, then worked again on Monday and Tuesday, states that it got significantly worse. States that it is sharp, spasms, radiates down his right leg with paraesthesia as well. Has tried TENS unit, heating pad, ibuprofen at home, not helping very much. If he is lying down with leg slightly lifted the pain is improved. Denies saddle anesthesia or incontinence.   Relevant past medical, surgical, family, and social history reviewed and updated as indicated.  Allergies and medications reviewed and updated. Data reviewed: Chart in Epic.   Past Medical History:  Diagnosis Date   Hypertension    Renal disorder    kidney stones   Sprain of lateral collateral ligament of right knee 12/26/2017    Past Surgical History:  Procedure Laterality Date   cyst removed from left arm and wrist     KNEE ARTHROSCOPY Right 02/25/2019    Social History   Socioeconomic History   Marital status: Married    Spouse name: Not on file   Number of children: Not on file   Years of education: Not on file   Highest education level: 12th grade  Occupational History   Not on file  Tobacco Use   Smoking status: Every Day    Current packs/day: 0.00    Average packs/day: 1 pack/day for 20.0 years (20.0 ttl pk-yrs)    Types: Cigarettes    Start date: 03/07/1982    Last attempt to quit: 03/07/2002    Years since quitting: 21.4   Smokeless tobacco: Never  Vaping Use   Vaping status:  Never Used  Substance and Sexual Activity   Alcohol use: Yes    Comment: occasionally   Drug use: No   Sexual activity: Not on file  Other Topics Concern   Not on file  Social History Narrative   Not on file   Social Drivers of Health   Financial Resource Strain: Low Risk  (02/21/2023)   Overall Financial Resource Strain (CARDIA)    Difficulty of Paying Living Expenses: Not very hard  Food Insecurity: No Food Insecurity (05/24/2023)   Hunger Vital Sign    Worried About Running Out of Food in the Last Year: Never true    Ran Out of Food in the Last Year: Never true  Transportation Needs: No Transportation Needs (05/24/2023)   PRAPARE - Administrator, Civil Service (Medical): No    Lack of Transportation (Non-Medical): No  Physical Activity: Inactive (02/21/2023)   Exercise Vital Sign    Days of Exercise per Week: 4 days    Minutes of Exercise per Session: 0 min  Stress: Stress Concern Present (02/21/2023)   [employer] of Occupational Health - Occupational Stress Questionnaire    Feeling of Stress : Very much  Social Connections: Moderately Isolated (02/21/2023)   Social Connection and Isolation Panel [NHANES]    Frequency of Communication  with Friends and Family: More than three times a week    Frequency of Social Gatherings with Friends and Family: More than three times a week    Attends Religious Services: 1 to 4 times per year    Active Member of [employer] or Organizations: No    Attends Banker Meetings: Not on file    Marital Status: Widowed  Intimate Partner Violence: Not At Risk (05/24/2023)   Humiliation, Afraid, Rape, and Kick questionnaire    Fear of Current or Ex-Partner: No    Emotionally Abused: No    Physically Abused: No    Sexually Abused: No    Outpatient Encounter Medications as of 07/26/2023  Medication Sig   amLODipine  (NORVASC ) 10 MG tablet Take 1 tablet (10 mg total) by mouth daily.   hydrochlorothiazide  (HYDRODIURIL ) 25  MG tablet Take 1 tablet (25 mg total) by mouth daily.   hydrOXYzine  (VISTARIL ) 25 MG capsule TAKE 1 CAPSULE BY MOUTH EVERY 8 HOURS AS NEEDED   lisinopril  (ZESTRIL ) 40 MG tablet Take 1 tablet by mouth once daily   naproxen  (NAPROSYN ) 500 MG tablet Take 1 tablet (500 mg total) by mouth 2 (two) times daily with a meal.   rosuvastatin  (CRESTOR ) 10 MG tablet Take 1 tablet by mouth once daily   sertraline  (ZOLOFT ) 100 MG tablet Take 1 tablet by mouth once daily   [DISCONTINUED] nicotine  polacrilex (NICORETTE ) 4 MG gum Take 1 each (4 mg total) by mouth as needed for smoking cessation.   No facility-administered encounter medications on file as of 07/26/2023.   No Known Allergies  Review of Systems As per HPI  Objective:  BP (!) 149/84   Pulse (!) 112   Temp 98.9 F (37.2 C)   Ht 5\' 4"  (1.626 m)   Wt 182 lb (82.6 kg)   SpO2 94%   BMI 31.24 kg/m    Wt Readings from Last 3 Encounters:  07/26/23 182 lb (82.6 kg)  05/24/23 180 lb (81.6 kg)  02/23/23 179 lb (81.2 kg)   Physical Exam Constitutional:      General: He is awake. He is not in acute distress.    Appearance: Normal appearance. He is well-developed and well-groomed. He is not ill-appearing, toxic-appearing or diaphoretic.  Cardiovascular:     Rate and Rhythm: Regular rhythm. Tachycardia present.     Pulses: Normal pulses.          Radial pulses are 2+ on the right side and 2+ on the left side.       Posterior tibial pulses are 2+ on the right side and 2+ on the left side.     Heart sounds: Normal heart sounds. No murmur heard.    No gallop.  Pulmonary:     Effort: Pulmonary effort is normal. No respiratory distress.     Breath sounds: Normal breath sounds. No stridor. No wheezing, rhonchi or rales.  Musculoskeletal:     Cervical back: Full passive range of motion without pain and neck supple.     Lumbar back: Spasms and tenderness present. No swelling, edema, deformity, signs of trauma, lacerations or bony tenderness.  Decreased range of motion. No scoliosis.     Right lower leg: No edema.     Left lower leg: No edema.     Comments: antalgic gait due to pain  Skin:    General: Skin is warm.     Capillary Refill: Capillary refill takes less than 2 seconds.  Neurological:     General:  No focal deficit present.     Mental Status: He is alert, oriented to person, place, and time and easily aroused. Mental status is at baseline.     GCS: GCS eye subscore is 4. GCS verbal subscore is 5. GCS motor subscore is 6.     Motor: No weakness.  Psychiatric:        Attention and Perception: Attention and perception normal.        Mood and Affect: Mood and affect normal.        Speech: Speech normal.        Behavior: Behavior normal. Behavior is cooperative.        Thought Content: Thought content normal. Thought content does not include homicidal or suicidal ideation. Thought content does not include homicidal or suicidal plan.        Cognition and Memory: Cognition and memory normal.        Judgment: Judgment normal.     Results for orders placed or performed in visit on 05/24/23  Bayer DCA Hb A1c Waived   Collection Time: 05/24/23  4:11 PM  Result Value Ref Range   HB A1C (BAYER DCA - WAIVED) 5.9 (H) 4.8 - 5.6 %  Lipid panel   Collection Time: 05/24/23  4:13 PM  Result Value Ref Range   Cholesterol, Total 128 100 - 199 mg/dL   Triglycerides 409 0 - 149 mg/dL   HDL 38 (L) >81 mg/dL   VLDL Cholesterol Cal 21 5 - 40 mg/dL   LDL Chol Calc (NIH) 69 0 - 99 mg/dL   Chol/HDL Ratio 3.4 0.0 - 5.0 ratio  TSH   Collection Time: 05/24/23  4:13 PM  Result Value Ref Range   TSH 1.470 0.450 - 4.500 uIU/mL  VITAMIN D  25 Hydroxy (Vit-D Deficiency, Fractures)   Collection Time: 05/24/23  4:13 PM  Result Value Ref Range   Vit D, 25-Hydroxy 33.2 30.0 - 100.0 ng/mL  CMP14+EGFR   Collection Time: 05/24/23  4:13 PM  Result Value Ref Range   Glucose 94 70 - 99 mg/dL   BUN 15 8 - 27 mg/dL   Creatinine, Ser 1.91 0.76 -  1.27 mg/dL   eGFR 86 >47 WG/NFA/2.13   BUN/Creatinine Ratio 15 10 - 24   Sodium 143 134 - 144 mmol/L   Potassium 4.5 3.5 - 5.2 mmol/L   Chloride 102 96 - 106 mmol/L   CO2 24 20 - 29 mmol/L   Calcium  10.1 8.6 - 10.2 mg/dL   Total Protein 7.6 6.0 - 8.5 g/dL   Albumin 4.9 3.9 - 4.9 g/dL   Globulin, Total 2.7 1.5 - 4.5 g/dL   Bilirubin Total 0.2 0.0 - 1.2 mg/dL   Alkaline Phosphatase 87 44 - 121 IU/L   AST 28 0 - 40 IU/L   ALT 38 0 - 44 IU/L  CBC with Differential/Platelet   Collection Time: 05/24/23  4:13 PM  Result Value Ref Range   WBC 9.8 3.4 - 10.8 x10E3/uL   RBC 5.27 4.14 - 5.80 x10E6/uL   Hemoglobin 15.9 13.0 - 17.7 g/dL   Hematocrit 08.6 57.8 - 51.0 %   MCV 90 79 - 97 fL   MCH 30.2 26.6 - 33.0 pg   MCHC 33.5 31.5 - 35.7 g/dL   RDW 46.9 62.9 - 52.8 %   Platelets 323 150 - 450 x10E3/uL   Neutrophils 51 Not Estab. %   Lymphs 36 Not Estab. %   Monocytes 11 Not Estab. %   Eos  1 Not Estab. %   Basos 0 Not Estab. %   Neutrophils Absolute 5.0 1.4 - 7.0 x10E3/uL   Lymphocytes Absolute 3.5 (H) 0.7 - 3.1 x10E3/uL   Monocytes Absolute 1.1 (H) 0.1 - 0.9 x10E3/uL   EOS (ABSOLUTE) 0.1 0.0 - 0.4 x10E3/uL   Basophils Absolute 0.0 0.0 - 0.2 x10E3/uL   Immature Granulocytes 1 Not Estab. %   Immature Grans (Abs) 0.1 0.0 - 0.1 x10E3/uL  ToxASSURE Select 13 (MW), Urine   Collection Time: 05/24/23  4:13 PM  Result Value Ref Range   Summary FINAL   Uric Acid   Collection Time: 05/24/23  4:13 PM  Result Value Ref Range   Uric Acid 5.9 3.8 - 8.4 mg/dL       1/61/0960    4:54 AM 05/24/2023    3:47 PM 02/23/2023    3:49 PM 11/24/2022    8:00 AM 09/27/2022    3:41 PM  Depression screen PHQ 2/9  Decreased Interest 0 0 2 0 0  Down, Depressed, Hopeless 0 1 2 0 0  PHQ - 2 Score 0 1 4 0 0  Altered sleeping 1 1 3  0 1  Tired, decreased energy 0 1 2 0 1  Change in appetite 0 0 2 0 1  Feeling bad or failure about yourself  0 0 2 0 0  Trouble concentrating 0 0 3 0 0  Moving slowly or  fidgety/restless 0 0 2 0 0  Suicidal thoughts 0 0 0 0 0  PHQ-9 Score 1 3 18  0 3  Difficult doing work/chores Not difficult at all Somewhat difficult Somewhat difficult  Somewhat difficult       07/26/2023    9:25 AM 05/24/2023    3:48 PM 02/23/2023    3:50 PM 11/24/2022    8:00 AM  GAD 7 : Generalized Anxiety Score  Nervous, Anxious, on Edge 0 1 3 1   Control/stop worrying 0 0 3 0  Worry too much - different things 0 0 3 0  Trouble relaxing 0 1 3 0  Restless 0 0 3 0  Easily annoyed or irritable 0 0 0 1  Afraid - awful might happen 0 0 1 0  Total GAD 7 Score 0 2 16 2   Anxiety Difficulty Not difficult at all Somewhat difficult     Pertinent labs & imaging results that were available during my care of the patient were reviewed by me and considered in my medical decision making.  Assessment & Plan:  Chayce was seen today for back pain radiating down right leg.  Diagnoses and all orders for this visit:  Acute midline low back pain with right-sided sciatica Injection provided below for acute pain management.  Will start medications as below  Discussed with patient when to return for further evaluation. Discussed red flags with patient.  Discussed side effects.  - methylPREDNISolone acetate (DEPO-MEDROL) injection 60 mg  Primary hypertension Elevated BP in office, most likely related to pain. Recommend pt monitor at home and follow up as previously planned.   Continue all other maintenance medications.  Follow up plan: Return if symptoms worsen or fail to improve.   Continue healthy lifestyle choices, including diet (rich in fruits, vegetables, and lean proteins, and low in salt and simple carbohydrates) and exercise (at least 30 minutes of moderate physical activity daily).  Written and verbal instructions provided   The above assessment and management plan was discussed with the patient. The patient verbalized understanding of and has agreed  to the management plan. Patient  is aware to call the clinic if they develop any new symptoms or if symptoms persist or worsen. Patient is aware when to return to the clinic for a follow-up visit. Patient educated on when it is appropriate to go to the emergency department.   Jacqualyn Mates, DNP-FNP [facility] [address] [place], [place] [address] [phone]

## 2023-07-31 ENCOUNTER — Other Ambulatory Visit: Payer: Self-pay | Admitting: Family Medicine

## 2023-08-15 ENCOUNTER — Other Ambulatory Visit: Payer: Self-pay | Admitting: Family Medicine

## 2023-08-15 DIAGNOSIS — I10 Essential (primary) hypertension: Secondary | ICD-10-CM

## 2023-08-29 ENCOUNTER — Encounter: Payer: Self-pay | Admitting: Family Medicine

## 2023-08-29 ENCOUNTER — Ambulatory Visit (INDEPENDENT_AMBULATORY_CARE_PROVIDER_SITE_OTHER): Admitting: Family Medicine

## 2023-08-29 ENCOUNTER — Ambulatory Visit: Admitting: Family Medicine

## 2023-08-29 VITALS — BP 132/72 | HR 91 | Temp 98.7°F | Ht 64.0 in | Wt 182.0 lb

## 2023-08-29 DIAGNOSIS — Z0000 Encounter for general adult medical examination without abnormal findings: Secondary | ICD-10-CM | POA: Diagnosis not present

## 2023-08-29 NOTE — Progress Notes (Signed)
 Terry Ryan is a 63 y.o. male presents to office today for annual physical exam examination.    Concerns today include: 1. None   Occupation: Terry Ryan  Marital status: widowed  Diet: all over the place  Exercise: laborious job  Starting to walk with daughter.  2x per week.  Substance use: smoking cigarettes.  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  Last eye exam: due next month  Last dental exam: due  Last colonoscopy: cologuard completed 08/2022 - negative  PSA: denies history of prostate cancer in family  Denies symptoms.  Refills needed today: none  Other specialists seen: no  Dermatology exam: not interested  Fasting today:  no  Immunizations needed: Flu Vaccine: no  Tdap Vaccine: not due   - every 31yrs - (<3 lifetime doses or unknown): all wounds -- look up need for Tetanus IG - (>=3 lifetime doses): clean/minor wound if >75yrs from previous; all other wounds if >71yrs from previous Zoster Vaccine: completed  (those >50yo, once) Pneumonia Vaccine: yes (those w/ risk factors) - (<40yr) Both: Immunocompromised, cochlear implant, CSF leak, asplenic, sickle cell, Chronic Renal Failure - (<54yr) PPSV-23 only: Heart dz, lung disease, DM, tobacco abuse, alcoholism, cirrhosis/liver disease. - (>1yr): PPSV13 then PPSV23 in 6-12mths;  - (>64yr): repeat PPSV23 once if pt received prior to 61yo and 40yrs have passed   Past Medical History:  Diagnosis Date   Hypertension    Renal disorder    kidney stones   Sprain of lateral collateral ligament of right knee 12/26/2017   Social History   Socioeconomic History   Marital status: Married    Spouse name: Not on file   Number of children: Not on file   Years of education: Not on file   Highest education level: 12th grade  Occupational History   Not on file  Tobacco Use   Smoking status: Every Day    Current packs/day: 0.00    Average packs/day: 1 pack/day for 20.0 years (20.0 ttl pk-yrs)     Types: Cigarettes    Start date: 03/07/1982    Last attempt to quit: 03/07/2002    Years since quitting: 21.4   Smokeless tobacco: Never  Vaping Use   Vaping status: Never Used  Substance and Sexual Activity   Alcohol use: Yes    Comment: occasionally   Drug use: No   Sexual activity: Not on file  Other Topics Concern   Not on file  Social History Narrative   Not on file   Social Drivers of Health   Financial Resource Strain: Low Risk  (02/21/2023)   Overall Financial Resource Strain (CARDIA)    Difficulty of Paying Living Expenses: Not very hard  Food Insecurity: No Food Insecurity (05/24/2023)   Hunger Vital Sign    Worried About Running Out of Food in the Last Year: Never true    Ran Out of Food in the Last Year: Never true  Transportation Needs: No Transportation Needs (05/24/2023)   PRAPARE - Administrator, Civil Service (Medical): No    Lack of Transportation (Non-Medical): No  Physical Activity: Inactive (02/21/2023)   Exercise Vital Sign    Days of Exercise per Week: 4 days    Minutes of Exercise per Session: 0 min  Stress: Stress Concern Present (02/21/2023)   [employer] of Occupational Health - Occupational Stress Questionnaire    Feeling of Stress : Very much  Social Connections: Moderately Isolated (02/21/2023)  Social Connection and Isolation Panel    Frequency of Communication with Friends and Family: More than three times a week    Frequency of Social Gatherings with Friends and Family: More than three times a week    Attends Religious Services: 1 to 4 times per year    Active Member of [employer] or Organizations: No    Attends Banker Meetings: Not on file    Marital Status: Widowed  Intimate Partner Violence: Not At Risk (05/24/2023)   Humiliation, Afraid, Rape, and Kick questionnaire    Fear of Current or Ex-Partner: No    Emotionally Abused: No    Physically Abused: No    Sexually Abused: No   Past Surgical History:   Procedure Laterality Date   cyst removed from left arm and wrist     KNEE ARTHROSCOPY Right 02/25/2019   Family History  Problem Relation Age of Onset   Migraines Mother    Alzheimer's disease Father     Current Outpatient Medications:    amLODipine  (NORVASC ) 10 MG tablet, Take 1 tablet by mouth once daily, Disp: 90 tablet, Rfl: 0   hydrochlorothiazide  (HYDRODIURIL ) 25 MG tablet, Take 1 tablet (25 mg total) by mouth daily., Disp: 90 tablet, Rfl: 3   hydrOXYzine  (VISTARIL ) 25 MG capsule, TAKE 1 CAPSULE BY MOUTH EVERY 8 HOURS AS NEEDED, Disp: 90 capsule, Rfl: 1   lisinopril  (ZESTRIL ) 40 MG tablet, Take 1 tablet by mouth once daily, Disp: 90 tablet, Rfl: 0   methocarbamol  (ROBAXIN ) 750 MG tablet, Take 1 tablet (750 mg total) by mouth 4 (four) times daily., Disp: 16 tablet, Rfl: 0   naproxen  (NAPROSYN ) 500 MG tablet, Take 1 tablet (500 mg total) by mouth 2 (two) times daily with a meal., Disp: 30 tablet, Rfl: 0   rosuvastatin  (CRESTOR ) 10 MG tablet, Take 1 tablet by mouth once daily, Disp: 90 tablet, Rfl: 0   sertraline  (ZOLOFT ) 100 MG tablet, Take 1 tablet by mouth once daily, Disp: 90 tablet, Rfl: 0  No Known Allergies   ROS: Review of Systems ROS  As per HPI   Physical exam    08/29/2023    2:57 PM 07/26/2023    9:19 AM 05/24/2023    3:43 PM  Vitals with BMI  Height 5' 4 5' 4 5' 4  Weight 182 lbs 182 lbs 180 lbs  BMI 31.22 31.22 30.88  Systolic 132 149 870  Diastolic 72 84 70  Pulse 91 112 96    Physical Exam Constitutional:      General: He is awake. He is not in acute distress.    Appearance: Normal appearance. He is well-developed, well-groomed and normal weight. He is not ill-appearing, toxic-appearing or diaphoretic.     Interventions: He is not intubated. HENT:     Head:     Salivary Glands: Right salivary gland is not diffusely enlarged or tender. Left salivary gland is not diffusely enlarged or tender.     Right Ear: No laceration, drainage, swelling or  tenderness. No middle ear effusion. There is no impacted cerumen. No foreign body. No mastoid tenderness. No PE tube. No hemotympanum. Tympanic membrane is not injected, scarred, perforated, erythematous, retracted or bulging.     Left Ear: No laceration, drainage, swelling or tenderness.  No middle ear effusion. There is no impacted cerumen. No foreign body. No mastoid tenderness. No PE tube. No hemotympanum. Tympanic membrane is not injected, scarred, perforated, erythematous, retracted or bulging.     Nose: No nasal  deformity, septal deviation, signs of injury, laceration, nasal tenderness, mucosal edema, congestion or rhinorrhea.     Right Sinus: No maxillary sinus tenderness or frontal sinus tenderness.     Left Sinus: No maxillary sinus tenderness or frontal sinus tenderness.   Eyes:     General: Lids are normal.     Extraocular Movements:     Right eye: Normal extraocular motion.     Left eye: Normal extraocular motion.     Conjunctiva/sclera:     Right eye: Right conjunctiva is not injected. No chemosis, exudate or hemorrhage.    Left eye: Left conjunctiva is not injected. No chemosis, exudate or hemorrhage.    Pupils: Pupils are equal, round, and reactive to light. Pupils are equal.     Right eye: Pupil is round, reactive and not sluggish. No corneal abrasion.     Left eye: Pupil is round, reactive and not sluggish. No corneal abrasion.     Funduscopic exam:    Right eye: No hemorrhage or exudate. Red reflex present.        Left eye: No hemorrhage or exudate. Red reflex present.  Neck:     Thyroid: No thyroid mass or thyromegaly.     Trachea: Trachea and phonation normal. No tracheal tenderness, tracheostomy or tracheal deviation.   Cardiovascular:     Rate and Rhythm: Normal rate and regular rhythm.     Pulses:          Radial pulses are 2+ on the right side and 2+ on the left side.     Heart sounds: Normal heart sounds.  Pulmonary:     Effort: Pulmonary effort is normal. No  tachypnea, bradypnea, accessory muscle usage, prolonged expiration, respiratory distress or retractions. He is not intubated.     Breath sounds: Normal breath sounds. No stridor, decreased air movement or transmitted upper airway sounds. No decreased breath sounds, wheezing, rhonchi or rales.  Abdominal:     General: Abdomen is flat. Bowel sounds are normal. There is no distension or abdominal bruit. There are no signs of injury.     Palpations: Abdomen is soft. There is no shifting dullness, fluid wave, hepatomegaly, splenomegaly, mass or pulsatile mass.     Tenderness: There is no abdominal tenderness. There is no right CVA tenderness, left CVA tenderness, guarding or rebound.     Hernia: No hernia is present.   Musculoskeletal:     Cervical back: Full passive range of motion without pain, normal range of motion and neck supple. No edema, erythema, rigidity, torticollis or crepitus. No pain with movement. Normal range of motion.     Right lower leg: No edema.     Left lower leg: No edema.  Lymphadenopathy:     Head:     Right side of head: No submental, submandibular, tonsillar, preauricular or posterior auricular adenopathy.     Left side of head: No submental, submandibular, tonsillar, preauricular or posterior auricular adenopathy.     Cervical: No cervical adenopathy.     Right cervical: No superficial, deep or posterior cervical adenopathy.    Left cervical: No superficial, deep or posterior cervical adenopathy.   Skin:    General: Skin is warm.     Capillary Refill: Capillary refill takes less than 2 seconds.   Neurological:     General: No focal deficit present.     Mental Status: He is alert, oriented to person, place, and time and easily aroused. Mental status is at baseline.  Cranial Nerves: Cranial nerves 2-12 are intact. No cranial nerve deficit or facial asymmetry.     Motor: Motor function is intact.     Gait: Gait is intact.   Psychiatric:        Attention and  Perception: Attention and perception normal.        Mood and Affect: Mood and affect normal.        Speech: Speech normal.        Behavior: Behavior normal. Behavior is cooperative.        Thought Content: Thought content normal.        Cognition and Memory: Cognition and memory normal.        Judgment: Judgment normal.         08/29/2023    3:03 PM 07/26/2023    9:26 AM 05/24/2023    3:47 PM  Depression screen PHQ 2/9  Decreased Interest 0 0 0  Down, Depressed, Hopeless 0 0 1  PHQ - 2 Score 0 0 1  Altered sleeping 1 1 1   Tired, decreased energy 0 0 1  Change in appetite 0 0 0  Feeling bad or failure about yourself  0 0 0  Trouble concentrating 0 0 0  Moving slowly or fidgety/restless 0 0 0  Suicidal thoughts 0 0 0  PHQ-9 Score 1 1 3   Difficult doing work/chores  Not difficult at all Somewhat difficult      08/29/2023    3:03 PM 07/26/2023    9:25 AM 05/24/2023    3:48 PM 02/23/2023    3:50 PM  GAD 7 : Generalized Anxiety Score  Nervous, Anxious, on Edge 0 0 1 3  Control/stop worrying 0 0 0 3  Worry too much - different things 0 0 0 3  Trouble relaxing 0 0 1 3  Restless 0 0 0 3  Easily annoyed or irritable 0 0 0 0  Afraid - awful might happen 0 0 0 1  Total GAD 7 Score 0 0 2 16  Anxiety Difficulty Not difficult at all Not difficult at all Somewhat difficult    Assessment/ Plan: Morna ONEIDA Rung here for annual physical exam.  1. Routine general medical examination at a health care facility (Primary) Discussed with patient to continue healthy lifestyle choices, including diet (rich in fruits, vegetables, and lean proteins, and low in salt and simple carbohydrates) and exercise (at least 30 minutes of moderate physical activity daily). Limit beverages high is sugar. Recommended at least 80-100 oz of water daily. Declined dermatology referral. Limited lower urinary symptoms. Recommend repeat labs in 6 months with PSA and HIV/hep c screening.   Patient to follow up in 1 year  for annual exam or sooner if needed.  The above assessment and management plan was discussed with the patient. The patient verbalized understanding of and has agreed to the management plan. Patient is aware to call the clinic if symptoms persist or worsen. Patient is aware when to return to the clinic for a follow-up visit. Patient educated on when it is appropriate to go to the emergency department.   Marry Kins, DNP-FNP [facility] [address] [place], [place] [address] [phone]

## 2023-08-29 NOTE — Patient Instructions (Signed)
Health Maintenance, Male Adopting a healthy lifestyle and getting preventive care are important in promoting health and wellness. Ask your health care provider about: The right schedule for you to have regular tests and exams. Things you can do on your own to prevent diseases and keep yourself healthy. What should I know about diet, weight, and exercise? Eat a healthy diet  Eat a diet that includes plenty of vegetables, fruits, low-fat dairy products, and lean protein. Do not eat a lot of foods that are high in solid fats, added sugars, or sodium. Maintain a healthy weight Body mass index (BMI) is a measurement that can be used to identify possible weight problems. It estimates body fat based on height and weight. Your health care provider can help determine your BMI and help you achieve or maintain a healthy weight. Get regular exercise Get regular exercise. This is one of the most important things you can do for your health. Most adults should: Exercise for at least 150 minutes each week. The exercise should increase your heart rate and make you sweat (moderate-intensity exercise). Do strengthening exercises at least twice a week. This is in addition to the moderate-intensity exercise. Spend less time sitting. Even light physical activity can be beneficial. Watch cholesterol and blood lipids Have your blood tested for lipids and cholesterol at 61 years of age, then have this test every 5 years. You may need to have your cholesterol levels checked more often if: Your lipid or cholesterol levels are high. You are older than 61 years of age. You are at high risk for heart disease. What should I know about cancer screening? Many types of cancers can be detected early and may often be prevented. Depending on your health history and family history, you may need to have cancer screening at various ages. This may include screening for: Colorectal cancer. Prostate cancer. Skin cancer. Lung  cancer. What should I know about heart disease, diabetes, and high blood pressure? Blood pressure and heart disease High blood pressure causes heart disease and increases the risk of stroke. This is more likely to develop in people who have high blood pressure readings or are overweight. Talk with your health care provider about your target blood pressure readings. Have your blood pressure checked: Every 3-5 years if you are 61-29 years of age. Every year if you are 61 years old or older. If you are between the ages of 64 and 60 and are a current or former smoker, ask your health care provider if you should have a one-time screening for abdominal aortic aneurysm (AAA). Diabetes Have regular diabetes screenings. This checks your fasting blood sugar level. Have the screening done: Once every three years after age 31 if you are at a normal weight and have a low risk for diabetes. More often and at a younger age if you are overweight or have a high risk for diabetes. What should I know about preventing infection? Hepatitis B If you have a higher risk for hepatitis B, you should be screened for this virus. Talk with your health care provider to find out if you are at risk for hepatitis B infection. Hepatitis C Blood testing is recommended for: Everyone born from [birth year] through 1963. Anyone with known risk factors for hepatitis C. Sexually transmitted infections (STIs) You should be screened each year for STIs, including gonorrhea and chlamydia, if: You are sexually active and are younger than 60 years of age. You are older than 61 years of age and your  health care provider tells you that you are at risk for this type of infection. Your sexual activity has changed since you were last screened, and you are at increased risk for chlamydia or gonorrhea. Ask your health care provider if you are at risk. Ask your health care provider about whether you are at high risk for HIV. Your health care provider  may recommend a prescription medicine to help prevent HIV infection. If you choose to take medicine to prevent HIV, you should first get tested for HIV. You should then be tested every 3 months for as long as you are taking the medicine. Follow these instructions at home: Alcohol use Do not drink alcohol if your health care provider tells you not to drink. If you drink alcohol: Limit how much you have to 0-2 drinks a day. Know how much alcohol is in your drink. In the [place] one drink equals one 12 oz bottle of beer (355 mL), one 5 oz glass of wine (148 mL), or one 1 oz glass of hard liquor (44 mL). Lifestyle Do not use any products that contain nicotine or tobacco. These products include cigarettes, chewing tobacco, and vaping devices, such as e-cigarettes. If you need help quitting, ask your health care provider. Do not use street drugs. Do not share needles. Ask your health care provider for help if you need support or information about quitting drugs. General instructions Schedule regular health, dental, and eye exams. Stay current with your vaccines. Tell your health care provider if: You often feel depressed. You have ever been abused or do not feel safe at home. Summary Adopting a healthy lifestyle and getting preventive care are important in promoting health and wellness. Follow your health care provider's instructions about healthy diet, exercising, and getting tested or screened for diseases. Follow your health care provider's instructions on monitoring your cholesterol and blood pressure. This information is not intended to replace advice given to you by your health care provider. Make sure you discuss any questions you have with your health care provider. Document Revised: 07/13/2020 Document Reviewed: 07/13/2020 Elsevier Patient Education  2024 [employer]  Colonoscopy, Adult A colonoscopy is a procedure to look at the entire large intestine. This procedure is done using a  long, thin, flexible tube that has a camera on the end. You may have a colonoscopy: As a part of normal colorectal screening. If you have certain symptoms, such as: A low number of red blood cells in your blood (anemia). Diarrhea that does not go away. Pain in your abdomen. Blood in your stool. A colonoscopy can help screen for and diagnose medical problems, including: An abnormal growth of cells or tissue (tumor). Abnormal growths within the lining of your intestine (polyps). Inflammation. Areas of bleeding. Tell your health care provider about: Any allergies you have. All medicines you are taking, including vitamins, herbs, eye drops, creams, and over-the-counter medicines. Any problems you or family members have had with anesthetic medicines. Any bleeding problems you have. Any surgeries you have had. Any medical conditions you have. Any problems you have had with having bowel movements. Whether you are pregnant or may be pregnant. What are the risks? Generally, this is a safe procedure. However, problems may occur, including: Bleeding. Damage to your intestine. Allergic reactions to medicines given during the procedure. Infection. This is rare. What happens before the procedure? Eating and drinking restrictions Follow instructions from your health care provider about eating or drinking restrictions, which may include: A few days before  the procedure: Follow a low-fiber diet. Avoid nuts, seeds, dried fruit, raw fruits, and vegetables. 1-3 days before the procedure: Eat only gelatin dessert or ice pops. Drink only clear liquids, such as water, clear juice, clear broth or bouillon, black coffee or tea, or clear soft drinks or sports drinks. Avoid liquids that contain red or purple dye. The day of the procedure: Do not eat solid foods. You may continue to drink clear liquids until up to 2 hours before the procedure. Do not eat or drink anything starting 2 hours before the  procedure, or within the time period that your health care provider recommends. Bowel prep If you were prescribed a bowel prep to take by mouth (orally) to clean out your colon: Take it as told by your health care provider. Starting the day before your procedure, you will need to drink a large amount of liquid medicine. The liquid will cause you to have many bowel movements of loose stool until your stool becomes almost clear or light green. If your skin or the opening between the buttocks (anus) gets irritated from diarrhea, you may relieve the irritation using: Wipes with medicine in them, such as adult wet wipes with aloe and vitamin E. A product to soothe skin, such as petroleum jelly. If you vomit while drinking the bowel prep: Take a break for up to 60 minutes. Begin the bowel prep again. Call your health care provider if you keep vomiting or you cannot take the bowel prep without vomiting. To clean out your colon, you may also be given: Laxative medicines. These help you have a bowel movement. Instructions for enema use. An enema is liquid medicine injected into your rectum. Medicines Ask your health care provider about: Changing or stopping your regular medicines or supplements. This is especially important if you are taking iron supplements, diabetes medicines, or blood thinners. Taking medicines such as aspirin and ibuprofen. These medicines can thin your blood. Do not take these medicines unless your health care provider tells you to take them. Taking over-the-counter medicines, vitamins, herbs, and supplements. General instructions Ask your health care provider what steps will be taken to help prevent infection. These may include washing skin with a germ-killing soap. If you will be going home right after the procedure, plan to have a responsible adult: Take you home from the hospital or clinic. You will not be allowed to drive. Care for you for the time you are told. What happens  during the procedure?  An IV will be inserted into one of your veins. You will be given a medicine to make you fall asleep (general anesthetic). You will lie on your side with your knees bent. A lubricant will be put on the tube. Then the tube will be: Inserted into your anus. Gently eased through all parts of your large intestine. Air will be sent into your colon to keep it open. This may cause some pressure or cramping. Images will be taken with the camera and will appear on a screen. A small tissue sample may be removed to be looked at under a microscope (biopsy). The tissue may be sent to a lab for testing if any signs of problems are found. If small polyps are found, they may be removed and checked for cancer cells. When the procedure is finished, the tube will be removed. The procedure may vary among health care providers and hospitals. What happens after the procedure? Your blood pressure, heart rate, breathing rate, and blood oxygen level will  be monitored until you leave the hospital or clinic. You may have a small amount of blood in your stool. You may pass gas and have mild cramping or bloating in your abdomen. This is caused by the air that was used to open your colon during the exam. If you were given a sedative during the procedure, it can affect you for several hours. Do not drive or operate machinery until your health care provider says that it is safe. It is up to you to get the results of your procedure. Ask your health care provider, or the department that is doing the procedure, when your results will be ready. Summary A colonoscopy is a procedure to look at the entire large intestine. Follow instructions from your health care provider about eating and drinking before the procedure. If you were prescribed an oral bowel prep to clean out your colon, take it as told by your health care provider. During the colonoscopy, a flexible tube with a camera on its end is inserted  into the anus and then passed into all parts of the large intestine. This information is not intended to replace advice given to you by your health care provider. Make sure you discuss any questions you have with your health care provider. Document Revised: 04/05/2022 Document Reviewed: 10/14/2020 Elsevier Patient Education  2024 [employer]     Why follow it? Research shows. Those who follow the Mediterranean diet have a reduced risk of heart disease  The diet is associated with a reduced incidence of Parkinson's and Alzheimer's diseases People following the diet may have longer life expectancies and lower rates of chronic diseases  The Dietary Guidelines for Americans recommends the Mediterranean diet as an eating plan to promote health and prevent disease  What Is the Mediterranean Diet?  Healthy eating plan based on typical foods and recipes of Mediterranean-style cooking The diet is primarily a plant based diet; these foods should make up a majority of meals   Starches - Plant based foods should make up a majority of meals - They are an important sources of vitamins, minerals, energy, antioxidants, and fiber - Choose whole grains, foods high in fiber and minimally processed items  - Typical grain sources include wheat, oats, barley, corn, brown rice, bulgar, farro, millet, polenta, couscous  - Various types of beans include chickpeas, lentils, fava beans, black beans, white beans   Fruits  Veggies - Large quantities of antioxidant rich fruits & veggies; 6 or more servings  - Vegetables can be eaten raw or lightly drizzled with oil and cooked  - Vegetables common to the traditional Mediterranean Diet include: artichokes, arugula, beets, broccoli, brussel sprouts, cabbage, carrots, celery, collard greens, cucumbers, eggplant, kale, leeks, lemons, lettuce, mushrooms, okra, onions, peas, peppers, potatoes, pumpkin, radishes, rutabaga, shallots, spinach, sweet potatoes, turnips, zucchini -  Fruits common to the Mediterranean Diet include: apples, apricots, avocados, cherries, clementines, dates, figs, grapefruits, grapes, melons, nectarines, oranges, peaches, pears, pomegranates, strawberries, tangerines  Fats - Replace butter and margarine with healthy oils, such as olive oil, canola oil, and tahini  - Limit nuts to no more than a handful a day  - Nuts include walnuts, almonds, pecans, pistachios, pine nuts  - Limit or avoid candied, honey roasted or heavily salted nuts - Olives are central to the Mediterranean diet - can be eaten whole or used in a variety of dishes   Meats Protein - Limiting red meat: no more than a few times a month - When eating  red meat: choose lean cuts and keep the portion to the size of deck of cards - Eggs: approx. 0 to 4 times a week  - Fish and lean poultry: at least 2 a week  - Healthy protein sources include, chicken, [place], lean beef, lamb - Increase intake of seafood such as tuna, salmon, trout, mackerel, shrimp, scallops - Avoid or limit high fat processed meats such as sausage and bacon  Dairy - Include moderate amounts of low fat dairy products  - Focus on healthy dairy such as fat free yogurt, skim milk, low or reduced fat cheese - Limit dairy products higher in fat such as whole or 2% milk, cheese, ice cream  Alcohol - Moderate amounts of red wine is ok  - No more than 5 oz daily for women (all ages) and men older than age 57  - No more than 10 oz of wine daily for men younger than 66  Other - Limit sweets and other desserts  - Use herbs and spices instead of salt to flavor foods  - Herbs and spices common to the traditional Mediterranean Diet include: basil, bay leaves, chives, cloves, cumin, fennel, garlic, lavender, marjoram, mint, oregano, parsley, pepper, rosemary, sage, savory, sumac, tarragon, thyme   It's not just a diet, it's a lifestyle:  The Mediterranean diet includes lifestyle factors typical of those in the region  Foods,  drinks and meals are best eaten with others and savored Daily physical activity is important for overall good health This could be strenuous exercise like running and aerobics This could also be more leisurely activities such as walking, housework, yard-work, or taking the stairs Moderation is the key; a balanced and healthy diet accommodates most foods and drinks Consider portion sizes and frequency of consumption of certain foods   Meal Ideas & Options:  Breakfast:  Whole wheat toast or whole wheat English muffins with peanut butter & hard boiled egg Steel cut oats topped with apples & cinnamon and skim milk  Fresh fruit: banana, strawberries, melon, berries, peaches  Smoothies: strawberries, bananas, greek yogurt, peanut butter Low fat greek yogurt with blueberries and granola  Egg white omelet with spinach and mushrooms Breakfast couscous: whole wheat couscous, apricots, skim milk, cranberries  Sandwiches:  Hummus and grilled vegetables (peppers, zucchini, squash) on whole wheat bread   Grilled chicken on whole wheat pita with lettuce, tomatoes, cucumbers or tzatziki  [place] salad on whole wheat bread: tuna salad made with greek yogurt, olives, red peppers, capers, green onions Garlic rosemary lamb pita: lamb sauted with garlic, rosemary, salt & pepper; add lettuce, cucumber, greek yogurt to pita - flavor with lemon juice and black pepper  Seafood:  Mediterranean grilled salmon, seasoned with garlic, basil, parsley, lemon juice and black pepper Shrimp, lemon, and spinach whole-grain pasta salad made with low fat greek yogurt  Seared scallops with lemon orzo  Seared tuna steaks seasoned salt, pepper, coriander topped with tomato mixture of olives, tomatoes, olive oil, minced garlic, parsley, green onions and cappers  Meats:  Herbed greek chicken salad with kalamata olives, cucumber, feta  Red bell peppers stuffed with spinach, bulgur, lean ground beef (or lentils) & topped with feta    Kebabs: skewers of chicken, tomatoes, onions, zucchini, squash  [place] burgers: made with red onions, mint, dill, lemon juice, feta cheese topped with roasted red peppers Vegetarian Cucumber salad: cucumbers, artichoke hearts, celery, red onion, feta cheese, tossed in olive oil & lemon juice  Hummus and whole grain pita points with  a greek salad (lettuce, tomato, feta, olives, cucumbers, red onion) Lentil soup with celery, carrots made with vegetable broth, garlic, salt and pepper  Tabouli salad: parsley, bulgur, mint, scallions, cucumbers, tomato, radishes, lemon juice, olive oil, salt and pepper.

## 2023-09-11 ENCOUNTER — Other Ambulatory Visit: Payer: Self-pay | Admitting: *Deleted

## 2023-09-11 DIAGNOSIS — F5104 Psychophysiologic insomnia: Secondary | ICD-10-CM

## 2023-09-11 DIAGNOSIS — I10 Essential (primary) hypertension: Secondary | ICD-10-CM

## 2023-09-11 DIAGNOSIS — F419 Anxiety disorder, unspecified: Secondary | ICD-10-CM

## 2023-09-11 MED ORDER — HYDROCHLOROTHIAZIDE 25 MG PO TABS: 25.0000 mg | ORAL_TABLET | Freq: Every day | ORAL | 1 refills | Status: DC

## 2023-09-11 MED ORDER — HYDROXYZINE PAMOATE 25 MG PO CAPS: ORAL_CAPSULE | ORAL | 1 refills | Status: DC

## 2023-09-11 NOTE — Addendum Note (Signed)
 Addended by: Xolani Degracia G on: 09/11/2023 01:26 PM   Modules accepted: Orders

## 2023-10-26 ENCOUNTER — Other Ambulatory Visit: Payer: Self-pay | Admitting: *Deleted

## 2023-10-26 MED ORDER — ROSUVASTATIN CALCIUM 10 MG PO TABS: 10.0000 mg | ORAL_TABLET | Freq: Every day | ORAL | 1 refills | Status: AC

## 2023-11-17 ENCOUNTER — Other Ambulatory Visit: Payer: Self-pay | Admitting: *Deleted

## 2023-11-17 DIAGNOSIS — I10 Essential (primary) hypertension: Secondary | ICD-10-CM

## 2023-11-17 MED ORDER — AMLODIPINE BESYLATE 10 MG PO TABS: 10.0000 mg | ORAL_TABLET | Freq: Every day | ORAL | 0 refills | Status: DC

## 2023-12-05 ENCOUNTER — Encounter: Payer: Self-pay | Admitting: Family Medicine

## 2023-12-05 ENCOUNTER — Ambulatory Visit: Payer: Self-pay | Admitting: Family Medicine

## 2023-12-05 ENCOUNTER — Ambulatory Visit (INDEPENDENT_AMBULATORY_CARE_PROVIDER_SITE_OTHER): Admitting: Family Medicine

## 2023-12-05 VITALS — BP 137/88 | HR 98 | Temp 98.3°F | Ht 64.0 in | Wt 187.0 lb

## 2023-12-05 DIAGNOSIS — K219 Gastro-esophageal reflux disease without esophagitis: Secondary | ICD-10-CM

## 2023-12-05 DIAGNOSIS — I10 Essential (primary) hypertension: Secondary | ICD-10-CM

## 2023-12-05 DIAGNOSIS — R7303 Prediabetes: Secondary | ICD-10-CM

## 2023-12-05 DIAGNOSIS — F1010 Alcohol abuse, uncomplicated: Secondary | ICD-10-CM

## 2023-12-05 DIAGNOSIS — F321 Major depressive disorder, single episode, moderate: Secondary | ICD-10-CM

## 2023-12-05 DIAGNOSIS — E785 Hyperlipidemia, unspecified: Secondary | ICD-10-CM

## 2023-12-05 DIAGNOSIS — F419 Anxiety disorder, unspecified: Secondary | ICD-10-CM | POA: Diagnosis not present

## 2023-12-05 DIAGNOSIS — F320 Major depressive disorder, single episode, mild: Secondary | ICD-10-CM

## 2023-12-05 LAB — BAYER DCA HB A1C WAIVED: HB A1C (BAYER DCA - WAIVED): 6 % — ABNORMAL HIGH (ref 4.8–5.6)

## 2023-12-05 MED ORDER — SERTRALINE HCL 50 MG PO TABS: 50.0000 mg | ORAL_TABLET | Freq: Every day | ORAL | 1 refills | Status: DC

## 2023-12-05 NOTE — Progress Notes (Signed)
 Subjective:  Patient ID: Terry Ryan, male    DOB: 1960-12-08, 63 y.o.   MRN: [id number]  Patient Care Team: Severa Rock HERO, FNP as PCP - General (Family Medicine)   Chief Complaint:  Establish Care (Previous Marry patient ), Medical Management of Chronic Issues, Anxiety, and Depression (Since wife passed away in [place]. )   HPI: Terry Ryan is a 63 y.o. male presenting on 12/05/2023 for Establish Care (Previous Marry patient ), Medical Management of Chronic Issues, Anxiety, and Depression (Since wife passed away in [place]. )   Terry Ryan is a 63 year old male with depression and anxiety who presents with worsening symptoms.  He has experienced worsening depression and anxiety since the passing of his wife on December 8th. The stress of managing the house and land has contributed to his symptoms. He was previously on sertraline  100 mg but discontinued it about a month or two ago.  He denies any thoughts of self-harm or suicidal ideation. He has not seen a counselor yet, as he was waiting for this consultation. He has taken precautions by asking someone to lock up his pistol.  He is currently taking hydrochlorothiazide  and amlodipine  for blood pressure, rosuvastatin  for cholesterol, and omeprazole for reflux. He also takes omega-3 fatty acids and reports no side effects from his blood pressure medications.  He drinks alcohol regularly, trying to cut back, but still consumes some during the week and more on weekends. He smokes and has considered using a nicotine  patch but has not started any smoking cessation aids yet.         12/05/2023    9:22 AM 08/29/2023    3:03 PM 07/26/2023    9:26 AM 05/24/2023    3:47 PM 02/23/2023    3:49 PM  Depression screen PHQ 2/9  Decreased Interest 2 0 0 0 2  Down, Depressed, Hopeless 2 0 0 1 2  PHQ - 2 Score 4 0 0 1 4  Altered sleeping 3 1 1 1 3   Tired, decreased energy 2 0 0 1 2  Change in appetite 1 0 0 0 2  Feeling  bad or failure about yourself  2 0 0 0 2  Trouble concentrating 2 0 0 0 3  Moving slowly or fidgety/restless 1 0 0 0 2  Suicidal thoughts 1 0 0 0 0  PHQ-9 Score 16 1 1 3 18   Difficult doing work/chores Somewhat difficult  Not difficult at all Somewhat difficult Somewhat difficult      12/05/2023    9:23 AM 08/29/2023    3:03 PM 07/26/2023    9:25 AM 05/24/2023    3:48 PM  GAD 7 : Generalized Anxiety Score  Nervous, Anxious, on Edge 2 0 0 1  Control/stop worrying 2 0 0 0  Worry too much - different things 2 0 0 0  Trouble relaxing 2 0 0 1  Restless 1 0 0 0  Easily annoyed or irritable 1 0 0 0  Afraid - awful might happen 0 0 0 0  Total GAD 7 Score 10 0 0 2  Anxiety Difficulty Somewhat difficult Not difficult at all Not difficult at all Somewhat difficult        Relevant past medical, surgical, family, and social history reviewed and updated as indicated.  Allergies and medications reviewed and updated. Data reviewed: Chart in Epic.   Past Medical History:  Diagnosis Date   Hypertension    Renal disorder  kidney stones   Sprain of lateral collateral ligament of right knee 12/26/2017    Past Surgical History:  Procedure Laterality Date   cyst removed from left arm and wrist     KNEE ARTHROSCOPY Right 02/25/2019    Social History   Socioeconomic History   Marital status: Widowed    Spouse name: Not on file   Number of children: Not on file   Years of education: Not on file   Highest education level: 12th grade  Occupational History   Not on file  Tobacco Use   Smoking status: Every Day    Current packs/day: 2.00    Average packs/day: 1 pack/day for 20.8 years (21.6 ttl pk-yrs)    Types: Cigarettes    Start date: 03/07/1982    Last attempt to quit: 03/07/2002   Smokeless tobacco: Never  Vaping Use   Vaping status: Every Day   Substances: CBD  Substance and Sexual Activity   Alcohol use: Yes    Comment: occasionally   Drug use: No   Sexual activity: Not  Currently  Other Topics Concern   Not on file  Social History Narrative   Not on file   Social Drivers of Health   Financial Resource Strain: Low Risk  (12/04/2023)   Overall Financial Resource Strain (CARDIA)    Difficulty of Paying Living Expenses: Not hard at all  Food Insecurity: No Food Insecurity (12/04/2023)   Hunger Vital Sign    Worried About Running Out of Food in the Last Year: Never true    Ran Out of Food in the Last Year: Never true  Transportation Needs: No Transportation Needs (12/04/2023)   PRAPARE - Administrator, Civil Service (Medical): No    Lack of Transportation (Non-Medical): No  Physical Activity: Insufficiently Active (12/04/2023)   Exercise Vital Sign    Days of Exercise per Week: 3 days    Minutes of Exercise per Session: 30 min  Stress: Stress Concern Present (12/04/2023)   [employer] of Occupational Health - Occupational Stress Questionnaire    Feeling of Stress: Very much  Social Connections: Socially Isolated (12/04/2023)   Social Connection and Isolation Panel    Frequency of Communication with Friends and Family: More than three times a week    Frequency of Social Gatherings with Friends and Family: More than three times a week    Attends Religious Services: Never    Database administrator or Organizations: No    Attends Banker Meetings: Not on file    Marital Status: Widowed  Intimate Partner Violence: Not At Risk (05/24/2023)   Humiliation, Afraid, Rape, and Kick questionnaire    Fear of Current or Ex-Partner: No    Emotionally Abused: No    Physically Abused: No    Sexually Abused: No    Outpatient Encounter Medications as of 12/05/2023  Medication Sig   amLODipine  (NORVASC ) 10 MG tablet Take 1 tablet (10 mg total) by mouth daily.   hydrochlorothiazide  (HYDRODIURIL ) 25 MG tablet Take 1 tablet (25 mg total) by mouth daily.   Omega-3 Fatty Acids (FISH OIL) 1000 MG CAPS Take 1,000 mg by mouth daily at 6 (six) AM.    omeprazole (PRILOSEC) 20 MG capsule Take 20 mg by mouth daily.   rosuvastatin  (CRESTOR ) 10 MG tablet Take 1 tablet (10 mg total) by mouth daily.   sertraline  (ZOLOFT ) 50 MG tablet Take 1 tablet (50 mg total) by mouth daily.   [DISCONTINUED] hydrOXYzine  (VISTARIL ) 25  MG capsule TAKE 1 CAPSULE BY MOUTH EVERY 8 HOURS AS NEEDED   [DISCONTINUED] lisinopril  (ZESTRIL ) 40 MG tablet Take 1 tablet by mouth once daily   [DISCONTINUED] methocarbamol  (ROBAXIN ) 750 MG tablet Take 1 tablet (750 mg total) by mouth 4 (four) times daily.   [DISCONTINUED] sertraline  (ZOLOFT ) 100 MG tablet Take 1 tablet by mouth once daily   No facility-administered encounter medications on file as of 12/05/2023.    No Known Allergies  Pertinent ROS per HPI, otherwise unremarkable      Objective:  BP 137/88   Pulse 98   Temp 98.3 F (36.8 C)   Ht 5' 4 (1.626 m)   Wt 187 lb (84.8 kg)   SpO2 95%   BMI 32.10 kg/m    Wt Readings from Last 3 Encounters:  12/05/23 187 lb (84.8 kg)  08/29/23 182 lb (82.6 kg)  07/26/23 182 lb (82.6 kg)    Physical Exam Vitals and nursing note reviewed.  Constitutional:      General: He is not in acute distress.    Appearance: Normal appearance. He is obese. He is not ill-appearing, toxic-appearing or diaphoretic.  HENT:     Head: Normocephalic and atraumatic.     Nose: Nose normal.     Mouth/Throat:     Mouth: Mucous membranes are moist.  Eyes:     Pupils: Pupils are equal, round, and reactive to light.  Cardiovascular:     Rate and Rhythm: Normal rate and regular rhythm.     Heart sounds: Normal heart sounds.  Pulmonary:     Effort: Pulmonary effort is normal.     Breath sounds: Normal breath sounds.  Musculoskeletal:     Right lower leg: No edema.     Left lower leg: No edema.  Skin:    General: Skin is warm and dry.     Capillary Refill: Capillary refill takes less than 2 seconds.  Neurological:     General: No focal deficit present.     Mental Status: He is  alert and oriented to person, place, and time.  Psychiatric:        Attention and Perception: Attention and perception normal.        Mood and Affect: Mood is depressed. Affect is tearful.        Behavior: Behavior normal. Behavior is cooperative.        Thought Content: Thought content normal. Thought content does not include suicidal ideation. Thought content does not include suicidal plan.        Cognition and Memory: Cognition and memory normal.        Judgment: Judgment normal.      Results for orders placed or performed in visit on 05/24/23  Bayer DCA Hb A1c Waived   Collection Time: 05/24/23  4:11 PM  Result Value Ref Range   HB A1C (BAYER DCA - WAIVED) 5.9 (H) 4.8 - 5.6 %  Lipid panel   Collection Time: 05/24/23  4:13 PM  Result Value Ref Range   Cholesterol, Total 128 100 - 199 mg/dL   Triglycerides 886 0 - 149 mg/dL   HDL 38 (L) >60 mg/dL   VLDL Cholesterol Cal 21 5 - 40 mg/dL   LDL Chol Calc (NIH) 69 0 - 99 mg/dL   Chol/HDL Ratio 3.4 0.0 - 5.0 ratio  TSH   Collection Time: 05/24/23  4:13 PM  Result Value Ref Range   TSH 1.470 0.450 - 4.500 uIU/mL  VITAMIN D  25 Hydroxy (Vit-D Deficiency,  Fractures)   Collection Time: 05/24/23  4:13 PM  Result Value Ref Range   Vit D, 25-Hydroxy 33.2 30.0 - 100.0 ng/mL  CMP14+EGFR   Collection Time: 05/24/23  4:13 PM  Result Value Ref Range   Glucose 94 70 - 99 mg/dL   BUN 15 8 - 27 mg/dL   Creatinine, Ser 8.99 0.76 - 1.27 mg/dL   eGFR 86 >40 fO/fpw/8.26   BUN/Creatinine Ratio 15 10 - 24   Sodium 143 134 - 144 mmol/L   Potassium 4.5 3.5 - 5.2 mmol/L   Chloride 102 96 - 106 mmol/L   CO2 24 20 - 29 mmol/L   Calcium  10.1 8.6 - 10.2 mg/dL   Total Protein 7.6 6.0 - 8.5 g/dL   Albumin 4.9 3.9 - 4.9 g/dL   Globulin, Total 2.7 1.5 - 4.5 g/dL   Bilirubin Total 0.2 0.0 - 1.2 mg/dL   Alkaline Phosphatase 87 44 - 121 IU/L   AST 28 0 - 40 IU/L   ALT 38 0 - 44 IU/L  CBC with Differential/Platelet   Collection Time: 05/24/23  4:13 PM   Result Value Ref Range   WBC 9.8 3.4 - 10.8 x10E3/uL   RBC 5.27 4.14 - 5.80 x10E6/uL   Hemoglobin 15.9 13.0 - 17.7 g/dL   Hematocrit 52.4 62.4 - 51.0 %   MCV 90 79 - 97 fL   MCH 30.2 26.6 - 33.0 pg   MCHC 33.5 31.5 - 35.7 g/dL   RDW 86.8 88.3 - 84.5 %   Platelets 323 150 - 450 x10E3/uL   Neutrophils 51 Not Estab. %   Lymphs 36 Not Estab. %   Monocytes 11 Not Estab. %   Eos 1 Not Estab. %   Basos 0 Not Estab. %   Neutrophils Absolute 5.0 1.4 - 7.0 x10E3/uL   Lymphocytes Absolute 3.5 (H) 0.7 - 3.1 x10E3/uL   Monocytes Absolute 1.1 (H) 0.1 - 0.9 x10E3/uL   EOS (ABSOLUTE) 0.1 0.0 - 0.4 x10E3/uL   Basophils Absolute 0.0 0.0 - 0.2 x10E3/uL   Immature Granulocytes 1 Not Estab. %   Immature Grans (Abs) 0.1 0.0 - 0.1 x10E3/uL  ToxASSURE Select 13 (MW), Urine   Collection Time: 05/24/23  4:13 PM  Result Value Ref Range   Summary FINAL   Uric Acid   Collection Time: 05/24/23  4:13 PM  Result Value Ref Range   Uric Acid 5.9 3.8 - 8.4 mg/dL       Pertinent labs & imaging results that were available during my care of the patient were reviewed by me and considered in my medical decision making.  Assessment & Plan:  Terry Ryan was seen today for establish care, medical management of chronic issues, anxiety and depression.  Diagnoses and all orders for this visit:  Depression, major, single episode, moderate (HCC) -     sertraline  (ZOLOFT ) 50 MG tablet; Take 1 tablet (50 mg total) by mouth daily. -     Ambulatory referral to Psychiatry  Anxiety -     sertraline  (ZOLOFT ) 50 MG tablet; Take 1 tablet (50 mg total) by mouth daily. -     Ambulatory referral to Psychiatry  Primary hypertension -     Bayer DCA Hb A1c Waived  Prediabetes -     Bayer DCA Hb A1c Waived  Alcohol abuse, episodic drinking behavior -     Bayer DCA Hb A1c Waived -     Ambulatory referral to Psychiatry  Gastroesophageal reflux disease without esophagitis -  Bayer DCA Hb A1c Waived  Dyslipidemia -      Bayer DCA Hb A1c Waived      Depression and Anxiety Disorder Depression and anxiety have worsened, likely exacerbated by the recent passing of his wife and the stress of managing household affairs. Abrupt discontinuation of sertraline  a month ago may have contributed to symptom exacerbation. No suicidal ideation or plans reported, but he has removed access to firearms. He is open to counseling. - Restart sertraline  at 50 mg: take half a tablet for the first five days, then a whole tablet. - Refer to counseling services in [place]. - Provide crisis intervention hotline numbers. - Advise on the availability of behavioral health urgent care in [facility] and [place]. - Encourage open communication with healthcare providers and family. - Schedule follow-up in 6-8 weeks to assess response to medication and counseling.  Essential Hypertension Blood pressure is well-controlled with hydrochlorothiazide  and amlodipine . No side effects reported. He acknowledges that reducing alcohol intake and smoking cessation could further improve blood pressure control. - Continue current antihypertensive medications: hydrochlorothiazide  and amlodipine . - Advise on reducing alcohol intake and smoking cessation to improve blood pressure control.          Continue all other maintenance medications.  Follow up plan: Return in 6 weeks (on 01/16/2024), or if symptoms worsen or fail to improve, for Depression, Anxiety.   Continue healthy lifestyle choices, including diet (rich in fruits, vegetables, and lean proteins, and low in salt and simple carbohydrates) and exercise (at least 30 minutes of moderate physical activity daily).  Educational handout given for depression  The above assessment and management plan was discussed with the patient. The patient verbalized understanding of and has agreed to the management plan. Patient is aware to call the clinic if they develop any new symptoms or if symptoms  persist or worsen. Patient is aware when to return to the clinic for a follow-up visit. Patient educated on when it is appropriate to go to the emergency department.   Rosaline Bruns, FNP-C [facility] [phone]

## 2023-12-05 NOTE — Patient Instructions (Signed)
If your symptoms worsen or you have thoughts of suicide/homicide, PLEASE SEEK IMMEDIATE MEDICAL ATTENTION.  You may always call the National Suicide Hotline.  This is available 24 hours a day, 7 days a week.  Their number is: 902 472 5297 ? ?Taking the medicine as directed and not missing any doses is one of the best things you can do to treat your depression.  Here are some things to keep in mind: ? ?Side effects (stomach upset, some increased anxiety) may happen before you notice a benefit.  These side effects typically go away over time. ?Changes to your dose of medicine or a change in medication all together is sometimes necessary ?Most people need to be on medication at least 12 months ?Many people will notice an improvement within two weeks but the full effect of the medication can take up to 4-6 weeks ?Stopping the medication when you start feeling better often results in a return of symptoms ?Never discontinue your medication without contacting a health care professional first.  Some medications require gradual discontinuation/ taper and can make you sick if you stop them abruptly. ? ?If your symptoms worsen or you have thoughts of suicide/homicide, PLEASE SEEK IMMEDIATE MEDICAL ATTENTION.  You may always call: ? ?National Suicide Hotline: (726)174-6311 ?[place] Crisis Line: 970-716-5189 ?Crisis Recovery in [place]: (910)422-0329 ?Cell Phone 988 ? ?These are available 24 hours a day, 7 days a week. ? ?

## 2024-01-05 ENCOUNTER — Encounter: Payer: Self-pay | Admitting: Family Medicine

## 2024-01-16 ENCOUNTER — Encounter: Payer: Self-pay | Admitting: Family Medicine

## 2024-01-16 ENCOUNTER — Ambulatory Visit: Payer: Self-pay | Admitting: Family Medicine

## 2024-01-16 VITALS — BP 130/79 | HR 75 | Temp 97.5°F | Ht 64.0 in | Wt 183.8 lb

## 2024-01-16 DIAGNOSIS — R7303 Prediabetes: Secondary | ICD-10-CM

## 2024-01-16 DIAGNOSIS — I10 Essential (primary) hypertension: Secondary | ICD-10-CM | POA: Diagnosis not present

## 2024-01-16 DIAGNOSIS — K219 Gastro-esophageal reflux disease without esophagitis: Secondary | ICD-10-CM

## 2024-01-16 DIAGNOSIS — F419 Anxiety disorder, unspecified: Secondary | ICD-10-CM

## 2024-01-16 DIAGNOSIS — J301 Allergic rhinitis due to pollen: Secondary | ICD-10-CM

## 2024-01-16 DIAGNOSIS — F320 Major depressive disorder, single episode, mild: Secondary | ICD-10-CM

## 2024-01-16 DIAGNOSIS — F321 Major depressive disorder, single episode, moderate: Secondary | ICD-10-CM | POA: Diagnosis not present

## 2024-01-16 DIAGNOSIS — E785 Hyperlipidemia, unspecified: Secondary | ICD-10-CM

## 2024-01-16 DIAGNOSIS — Z720 Tobacco use: Secondary | ICD-10-CM

## 2024-01-16 DIAGNOSIS — F1010 Alcohol abuse, uncomplicated: Secondary | ICD-10-CM

## 2024-01-16 LAB — BAYER DCA HB A1C WAIVED: HB A1C (BAYER DCA - WAIVED): 5.8 % — ABNORMAL HIGH (ref 4.8–5.6)

## 2024-01-16 MED ORDER — FLUTICASONE PROPIONATE 50 MCG/ACT NA SUSP: 2.0000 | Freq: Every day | NASAL | 6 refills | Status: AC

## 2024-01-16 MED ORDER — SERTRALINE HCL 100 MG PO TABS: 100.0000 mg | ORAL_TABLET | Freq: Every day | ORAL | 3 refills | Status: AC

## 2024-01-16 NOTE — Progress Notes (Signed)
 Subjective:  Patient ID: Terry Ryan, male    DOB: 1961-02-04, 63 y.o.   MRN: [id number]  Patient Care Team: Severa Rock HERO, FNP as PCP - General (Family Medicine)   Chief Complaint:  Depression and Follow-up   HPI: Terry Ryan is a 63 y.o. male presenting on 01/16/2024 for Depression and Follow-up   Terry Ryan is a 63 year old male who presents for medication management of depression.  Depressive symptoms and medication management - Currently taking sertraline  50 mg once daily in the morning for depression - No side effects from sertraline  - No suicidal ideation or thoughts of self-harm - Experiencing increased nervous energy  Alcohol and tobacco use - Significantly reduced alcohol intake - No liquor consumption in over two months - Limiting alcohol intake to three beers per week - Continues to smoke and is attempting smoking cessation, but finds it difficult  Cephalalgia and sinus symptoms - Experiencing headaches attributed to sinus issues, particularly with changing weather - Not using intranasal fluticasone  Gastrointestinal symptoms and medication adherence - Continues to take reflux medication without issues - Compliant with omega-3 supplements and rosuvastatin   Hypertension management - Taking amlodipine  and hydrochlorothiazide  for blood pressure control - No reported problems with antihypertensive medications  Appetite changes - Decreased appetite, unable to eat as much as previously - Increased frequency of snacking - No increased hunger, thirst, or urination  Glycemic status - Last hemoglobin A1c in the prediabetes range - No laboratory testing since March           01/16/2024   10:32 AM 12/05/2023    9:22 AM 08/29/2023    3:03 PM 07/26/2023    9:26 AM 05/24/2023    3:47 PM  Depression screen PHQ 2/9  Decreased Interest 0 2 0 0 0  Down, Depressed, Hopeless 1 2 0 0 1  PHQ - 2 Score 1 4 0 0 1  Altered sleeping 1 3 1 1 1   Tired,  decreased energy 1 2 0 0 1  Change in appetite 0 1 0 0 0  Feeling bad or failure about yourself  0 2 0 0 0  Trouble concentrating 0 2 0 0 0  Moving slowly or fidgety/restless 0 1 0 0 0  Suicidal thoughts 0 1 0 0 0  PHQ-9 Score 3 16  1  1  3    Difficult doing work/chores Not difficult at all Somewhat difficult  Not difficult at all Somewhat difficult     Data saved with a previous flowsheet row definition      01/16/2024   10:33 AM 12/05/2023    9:23 AM 08/29/2023    3:03 PM 07/26/2023    9:25 AM  GAD 7 : Generalized Anxiety Score  Nervous, Anxious, on Edge 1 2 0 0  Control/stop worrying 0 2 0 0  Worry too much - different things 1 2 0 0  Trouble relaxing 0 2 0 0  Restless 0 1 0 0  Easily annoyed or irritable 1 1 0 0  Afraid - awful might happen 0 0 0 0  Total GAD 7 Score 3 10 0 0  Anxiety Difficulty Not difficult at all Somewhat difficult Not difficult at all Not difficult at all       Relevant past medical, surgical, family, and social history reviewed and updated as indicated.  Allergies and medications reviewed and updated. Data reviewed: Chart in Epic.   Past Medical History:  Diagnosis Date  Hypertension    Renal disorder    kidney stones   Sprain of lateral collateral ligament of right knee 12/26/2017    Past Surgical History:  Procedure Laterality Date   cyst removed from left arm and wrist     KNEE ARTHROSCOPY Right 02/25/2019    Social History   Socioeconomic History   Marital status: Widowed    Spouse name: Not on file   Number of children: Not on file   Years of education: Not on file   Highest education level: 12th grade  Occupational History   Not on file  Tobacco Use   Smoking status: Every Day    Current packs/day: 2.00    Average packs/day: 1 pack/day for 20.9 years (21.9 ttl pk-yrs)    Types: Cigarettes    Start date: 03/07/1982    Last attempt to quit: 03/07/2002   Smokeless tobacco: Never  Vaping Use   Vaping status: Every Day    Substances: CBD  Substance and Sexual Activity   Alcohol use: Yes    Comment: occasionally   Drug use: No   Sexual activity: Not Currently  Other Topics Concern   Not on file  Social History Narrative   Not on file   Social Drivers of Health   Financial Resource Strain: Low Risk  (12/04/2023)   Overall Financial Resource Strain (CARDIA)    Difficulty of Paying Living Expenses: Not hard at all  Food Insecurity: No Food Insecurity (12/04/2023)   Hunger Vital Sign    Worried About Running Out of Food in the Last Year: Never true    Ran Out of Food in the Last Year: Never true  Transportation Needs: No Transportation Needs (12/04/2023)   PRAPARE - Administrator, Civil Service (Medical): No    Lack of Transportation (Non-Medical): No  Physical Activity: Insufficiently Active (12/04/2023)   Exercise Vital Sign    Days of Exercise per Week: 3 days    Minutes of Exercise per Session: 30 min  Stress: Stress Concern Present (12/04/2023)   [employer] of Occupational Health - Occupational Stress Questionnaire    Feeling of Stress: Very much  Social Connections: Socially Isolated (12/04/2023)   Social Connection and Isolation Panel    Frequency of Communication with Friends and Family: More than three times a week    Frequency of Social Gatherings with Friends and Family: More than three times a week    Attends Religious Services: Never    Database Administrator or Organizations: No    Attends Banker Meetings: Not on file    Marital Status: Widowed  Intimate Partner Violence: Not At Risk (05/24/2023)   Humiliation, Afraid, Rape, and Kick questionnaire    Fear of Current or Ex-Partner: No    Emotionally Abused: No    Physically Abused: No    Sexually Abused: No    Outpatient Encounter Medications as of 01/16/2024  Medication Sig   fluticasone (FLONASE) 50 MCG/ACT nasal spray Place 2 sprays into both nostrils daily.   sertraline  (ZOLOFT ) 100 MG tablet Take  1 tablet (100 mg total) by mouth daily.   amLODipine  (NORVASC ) 10 MG tablet Take 1 tablet (10 mg total) by mouth daily.   hydrochlorothiazide  (HYDRODIURIL ) 25 MG tablet Take 1 tablet (25 mg total) by mouth daily.   Omega-3 Fatty Acids (FISH OIL) 1000 MG CAPS Take 1,000 mg by mouth daily at 6 (six) AM.   omeprazole (PRILOSEC) 20 MG capsule Take 20 mg by  mouth daily.   rosuvastatin  (CRESTOR ) 10 MG tablet Take 1 tablet (10 mg total) by mouth daily.   [DISCONTINUED] sertraline  (ZOLOFT ) 50 MG tablet Take 1 tablet (50 mg total) by mouth daily.   No facility-administered encounter medications on file as of 01/16/2024.    No Known Allergies  Pertinent ROS per HPI, otherwise unremarkable      Objective:  BP 130/79   Pulse 75   Temp (!) 97.5 F (36.4 C)   Ht 5' 4 (1.626 m)   Wt 183 lb 12.8 oz (83.4 kg)   SpO2 97%   BMI 31.55 kg/m    Wt Readings from Last 3 Encounters:  01/16/24 183 lb 12.8 oz (83.4 kg)  12/05/23 187 lb (84.8 kg)  08/29/23 182 lb (82.6 kg)    Physical Exam Vitals and nursing note reviewed.  Constitutional:      General: He is not in acute distress.    Appearance: Normal appearance. He is obese. He is not ill-appearing, toxic-appearing or diaphoretic.  HENT:     Head: Normocephalic and atraumatic.     Nose: Nose normal.     Mouth/Throat:     Mouth: Mucous membranes are moist.  Eyes:     Conjunctiva/sclera: Conjunctivae normal.     Pupils: Pupils are equal, round, and reactive to light.  Cardiovascular:     Rate and Rhythm: Normal rate and regular rhythm.     Heart sounds: Normal heart sounds.  Pulmonary:     Effort: Pulmonary effort is normal.     Breath sounds: Normal breath sounds.  Abdominal:     Palpations: Abdomen is soft.  Musculoskeletal:     Right lower leg: No edema.     Left lower leg: No edema.  Skin:    General: Skin is warm and dry.     Capillary Refill: Capillary refill takes less than 2 seconds.  Neurological:     General: No focal  deficit present.     Mental Status: He is alert and oriented to person, place, and time.  Psychiatric:        Mood and Affect: Mood normal.        Behavior: Behavior normal.        Thought Content: Thought content normal.        Judgment: Judgment normal.     Results for orders placed or performed in visit on 12/05/23  Bayer DCA Hb A1c Waived   Collection Time: 12/05/23  9:20 AM  Result Value Ref Range   HB A1C (BAYER DCA - WAIVED) 6.0 (H) 4.8 - 5.6 %       Pertinent labs & imaging results that were available during my care of the patient were reviewed by me and considered in my medical decision making.  Assessment & Plan:  Clell was seen today for depression and follow-up.  Diagnoses and all orders for this visit:  Depression, major, single episode, moderate (HCC) -     sertraline  (ZOLOFT ) 100 MG tablet; Take 1 tablet (100 mg total) by mouth daily. -     CMP14+EGFR -     CBC with Differential/Platelet -     TSH -     T4, Free  Anxiety -     sertraline  (ZOLOFT ) 100 MG tablet; Take 1 tablet (100 mg total) by mouth daily. -     CMP14+EGFR -     CBC with Differential/Platelet -     TSH -     T4, Free  Alcohol  abuse, episodic drinking behavior -     CMP14+EGFR -     CBC with Differential/Platelet -     Bayer DCA Hb A1c Waived  Primary hypertension -     CMP14+EGFR -     CBC with Differential/Platelet -     Lipid panel -     TSH -     T4, Free -     Bayer DCA Hb A1c Waived  Gastroesophageal reflux disease without esophagitis -     CMP14+EGFR -     CBC with Differential/Platelet  Dyslipidemia -     Lipid panel  Prediabetes -     CMP14+EGFR -     CBC with Differential/Platelet -     Lipid panel -     TSH -     T4, Free -     Bayer DCA Hb A1c Waived  Tobacco abuse -     CBC with Differential/Platelet  Seasonal allergic rhinitis due to pollen -     fluticasone (FLONASE) 50 MCG/ACT nasal spray; Place 2 sprays into both nostrils daily.     Depression  and Anxiety Disorder Currently on Zoloft  50 mg with no significant side effects. Reports nervous energy but no suicidal ideation. Plan to increase dose to 100 mg to better manage symptoms. Discussed potential for further titration up to 200-300 mg if needed. - Increased Zoloft  to 100 mg once daily - Ordered labs to monitor sodium levels and thyroid function  Alcohol Use, in Remission Reports no alcohol consumption for over two months, with a maximum of three beers per week. Feels better with reduced intake.  Tobacco Use Attempting to quit smoking but finds it challenging. Advised not to attempt multiple lifestyle changes simultaneously.  Essential Hypertension Blood pressure is well-controlled with current medication regimen. No reports of chest pain or leg swelling. - Continue current antihypertensive regimen  Hyperlipidemia Continues on omega-3 supplements and Crestor  without issues. - Continue current lipid-lowering therapy  Gastroesophageal Reflux Disease (GERD) Continues on reflux medication with good control of symptoms. - Continue current reflux medication  Prediabetes Last A1c was in the prediabetes range. No increased hunger, thirst, or urination reported. Diet not optimal, but no acute issues. - Ordered labs to monitor A1c and other relevant parameters  General Health Maintenance Discussed the use of Flonase for sinus-related headaches. - Recommended Flonase for sinus headaches          Continue all other maintenance medications.  Follow up plan: Return in about 3 months (around 04/17/2024), or if symptoms worsen or fail to improve.   Continue healthy lifestyle choices, including diet (rich in fruits, vegetables, and lean proteins, and low in salt and simple carbohydrates) and exercise (at least 30 minutes of moderate physical activity daily).  Educational handout given for depression   The above assessment and management plan was discussed with the patient. The  patient verbalized understanding of and has agreed to the management plan. Patient is aware to call the clinic if they develop any new symptoms or if symptoms persist or worsen. Patient is aware when to return to the clinic for a follow-up visit. Patient educated on when it is appropriate to go to the emergency department.   Rosaline Bruns, FNP-C [facility] [phone]

## 2024-01-16 NOTE — Patient Instructions (Signed)
 If your symptoms worsen or you have thoughts of suicide/homicide, PLEASE SEEK IMMEDIATE MEDICAL ATTENTION.  You may always call the National Suicide Hotline.  This is available 24 hours a day, 7 days a week.  Their number is: 415 699 8926  Taking the medicine as directed and not missing any doses is one of the best things you can do to treat your depression.  Here are some things to keep in mind:  Side effects (stomach upset, some increased anxiety/depression, or suicidal thoughts) may happen before you notice a benefit. These side effects typically go away over time. If you have any suicidal thoughts, you need to stop the medications and be evaluated by a medical provider.  Changes to your dose of medicine or a change in medication all together is sometimes necessary Most people need to be on medication at least 6 months Many people will notice an improvement within two weeks but the full effect of the medication can take up to 4-6 weeks Stopping the medication when you start feeling better often results in a return of symptoms Never discontinue your medication without contacting a health care professional first.  Some medications require gradual discontinuation/ taper and can make you sick if you stop them abruptly.  If your symptoms worsen or you have thoughts of suicide/homicide, PLEASE SEEK IMMEDIATE MEDICAL ATTENTION.  You may always call:  National Suicide Hotline: 9393857068 [place] Crisis Line: (609)041-2692 Crisis Recovery in [place]: [phone]  Cell Phone 988  These are available 24 hours a day, 7 days a week.

## 2024-01-17 LAB — LIPID PANEL
Chol/HDL Ratio: 3.6 ratio (ref 0.0–5.0)
Cholesterol, Total: 101 mg/dL (ref 100–199)
HDL: 28 mg/dL — ABNORMAL LOW (ref >39)
LDL Chol Calc (NIH): 49 mg/dL (ref 0–99)
Triglycerides: 136 mg/dL (ref 0–149)
VLDL Cholesterol Cal: 24 mg/dL (ref 5–40)

## 2024-01-17 LAB — CMP14+EGFR
ALT: 54 IU/L — ABNORMAL HIGH (ref 0–44)
AST: 42 IU/L — ABNORMAL HIGH (ref 0–40)
Albumin: 4.6 g/dL (ref 3.9–4.9)
Alkaline Phosphatase: 74 IU/L (ref 47–123)
BUN/Creatinine Ratio: 14 (ref 10–24)
BUN: 14 mg/dL (ref 8–27)
Bilirubin Total: 0.4 mg/dL (ref 0.0–1.2)
CO2: 28 mmol/L (ref 20–29)
Calcium: 10.4 mg/dL — ABNORMAL HIGH (ref 8.6–10.2)
Chloride: 98 mmol/L (ref 96–106)
Creatinine, Ser: 1 mg/dL (ref 0.76–1.27)
Globulin, Total: 2.6 g/dL (ref 1.5–4.5)
Glucose: 98 mg/dL (ref 70–99)
Potassium: 4.5 mmol/L (ref 3.5–5.2)
Sodium: 140 mmol/L (ref 134–144)
Total Protein: 7.2 g/dL (ref 6.0–8.5)
eGFR: 86 mL/min/1.73 (ref >59)

## 2024-01-17 LAB — CBC WITH DIFFERENTIAL/PLATELET
Basophils Absolute: 0 x10E3/uL (ref 0.0–0.2)
Basos: 0 %
EOS (ABSOLUTE): 0.1 x10E3/uL (ref 0.0–0.4)
Eos: 1 %
Hematocrit: 50.8 % (ref 37.5–51.0)
Hemoglobin: 16.5 g/dL (ref 13.0–17.7)
Immature Grans (Abs): 0.1 x10E3/uL (ref 0.0–0.1)
Immature Granulocytes: 1 %
Lymphocytes Absolute: 3.5 x10E3/uL — ABNORMAL HIGH (ref 0.7–3.1)
Lymphs: 37 %
MCH: 29.9 pg (ref 26.6–33.0)
MCHC: 32.5 g/dL (ref 31.5–35.7)
MCV: 92 fL (ref 79–97)
Monocytes Absolute: 1 x10E3/uL — ABNORMAL HIGH (ref 0.1–0.9)
Monocytes: 10 %
Neutrophils Absolute: 4.9 x10E3/uL (ref 1.4–7.0)
Neutrophils: 51 %
Platelets: 356 x10E3/uL (ref 150–450)
RBC: 5.51 x10E6/uL (ref 4.14–5.80)
RDW: 12.8 % (ref 11.6–15.4)
WBC: 9.5 x10E3/uL (ref 3.4–10.8)

## 2024-01-17 LAB — T4, FREE: Free T4: 1.44 ng/dL (ref 0.82–1.77)

## 2024-01-17 LAB — TSH: TSH: 1.13 u[IU]/mL (ref 0.450–4.500)

## 2024-02-15 ENCOUNTER — Other Ambulatory Visit: Payer: Self-pay | Admitting: Family Medicine

## 2024-02-15 DIAGNOSIS — I10 Essential (primary) hypertension: Secondary | ICD-10-CM

## 2024-03-01 ENCOUNTER — Ambulatory Visit: Admitting: Family Medicine

## 2024-03-02 ENCOUNTER — Other Ambulatory Visit: Payer: Self-pay | Admitting: Nurse Practitioner

## 2024-03-02 DIAGNOSIS — I10 Essential (primary) hypertension: Secondary | ICD-10-CM

## 2024-04-17 ENCOUNTER — Ambulatory Visit: Admitting: Family Medicine
